# Patient Record
Sex: Female | Born: 1985 | Race: White | Hispanic: No | Marital: Married | Smoking: Current every day smoker
Health system: Southern US, Community
[De-identification: ages and names within clinical notes are randomized; demographics above are authoritative.]

## PROBLEM LIST (undated history)

## (undated) DIAGNOSIS — C539 Malignant neoplasm of cervix uteri, unspecified: Secondary | ICD-10-CM

## (undated) DIAGNOSIS — M1990 Unspecified osteoarthritis, unspecified site: Secondary | ICD-10-CM

## (undated) DIAGNOSIS — F329 Major depressive disorder, single episode, unspecified: Secondary | ICD-10-CM

## (undated) DIAGNOSIS — F32A Depression, unspecified: Secondary | ICD-10-CM

## (undated) DIAGNOSIS — R918 Other nonspecific abnormal finding of lung field: Secondary | ICD-10-CM

## (undated) DIAGNOSIS — R569 Unspecified convulsions: Secondary | ICD-10-CM

## (undated) DIAGNOSIS — K589 Irritable bowel syndrome without diarrhea: Secondary | ICD-10-CM

## (undated) DIAGNOSIS — F319 Bipolar disorder, unspecified: Secondary | ICD-10-CM

## (undated) DIAGNOSIS — K219 Gastro-esophageal reflux disease without esophagitis: Secondary | ICD-10-CM

## (undated) DIAGNOSIS — F410 Panic disorder [episodic paroxysmal anxiety] without agoraphobia: Secondary | ICD-10-CM

## (undated) HISTORY — DX: Unspecified convulsions: R56.9

## (undated) HISTORY — DX: Unspecified osteoarthritis, unspecified site: M19.90

## (undated) HISTORY — DX: Panic disorder (episodic paroxysmal anxiety): F41.0

## (undated) HISTORY — DX: Other nonspecific abnormal finding of lung field: R91.8

## (undated) HISTORY — DX: Irritable bowel syndrome, unspecified: K58.9

## (undated) HISTORY — DX: Gastro-esophageal reflux disease without esophagitis: K21.9

## (undated) HISTORY — PX: CERVICAL BIOPSY: SHX590

## (undated) HISTORY — PX: OTHER SURGICAL HISTORY: SHX169

## (undated) HISTORY — DX: Bipolar disorder, unspecified: F31.9

---
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
## 2004-07-13 ENCOUNTER — Emergency Department: Payer: Self-pay | Admitting: General Practice

## 2005-02-13 ENCOUNTER — Emergency Department: Payer: Self-pay | Admitting: Emergency Medicine

## 2005-02-19 ENCOUNTER — Emergency Department: Payer: Self-pay | Admitting: General Practice

## 2005-04-30 ENCOUNTER — Emergency Department: Payer: Self-pay | Admitting: Emergency Medicine

## 2005-05-07 ENCOUNTER — Emergency Department: Payer: Self-pay | Admitting: Unknown Physician Specialty

## 2005-08-28 ENCOUNTER — Observation Stay: Payer: Self-pay | Admitting: Obstetrics & Gynecology

## 2005-11-01 ENCOUNTER — Inpatient Hospital Stay: Payer: Self-pay

## 2005-12-03 ENCOUNTER — Other Ambulatory Visit: Payer: Self-pay

## 2005-12-03 ENCOUNTER — Emergency Department: Payer: Self-pay | Admitting: Emergency Medicine

## 2005-12-04 ENCOUNTER — Ambulatory Visit: Payer: Self-pay | Admitting: Emergency Medicine

## 2005-12-07 ENCOUNTER — Ambulatory Visit: Payer: Self-pay | Admitting: Emergency Medicine

## 2006-05-06 ENCOUNTER — Emergency Department: Payer: Self-pay | Admitting: Emergency Medicine

## 2006-05-07 ENCOUNTER — Ambulatory Visit: Payer: Self-pay | Admitting: Emergency Medicine

## 2006-08-17 DIAGNOSIS — G40409 Other generalized epilepsy and epileptic syndromes, not intractable, without status epilepticus: Secondary | ICD-10-CM

## 2006-08-17 DIAGNOSIS — R569 Unspecified convulsions: Secondary | ICD-10-CM

## 2006-08-17 HISTORY — DX: Unspecified convulsions: R56.9

## 2006-08-17 HISTORY — DX: Other generalized epilepsy and epileptic syndromes, not intractable, without status epilepticus: G40.409

## 2006-09-20 ENCOUNTER — Emergency Department: Payer: Self-pay | Admitting: Emergency Medicine

## 2008-05-25 ENCOUNTER — Emergency Department: Payer: Self-pay | Admitting: Emergency Medicine

## 2008-11-19 ENCOUNTER — Ambulatory Visit: Payer: Self-pay | Admitting: Family Medicine

## 2009-01-03 ENCOUNTER — Emergency Department: Payer: Self-pay | Admitting: Emergency Medicine

## 2009-02-26 ENCOUNTER — Emergency Department: Payer: Self-pay

## 2009-03-13 ENCOUNTER — Ambulatory Visit: Payer: Self-pay | Admitting: Neurology

## 2009-04-04 ENCOUNTER — Inpatient Hospital Stay: Payer: Self-pay | Admitting: Psychiatry

## 2009-04-16 ENCOUNTER — Emergency Department: Payer: Self-pay | Admitting: Emergency Medicine

## 2009-05-07 ENCOUNTER — Emergency Department: Payer: Self-pay | Admitting: Emergency Medicine

## 2009-05-31 ENCOUNTER — Ambulatory Visit: Payer: Self-pay | Admitting: Family Medicine

## 2014-10-17 ENCOUNTER — Ambulatory Visit
Admit: 2014-10-17 | Disposition: A | Discharge status: Home / Self Care | Payer: Self-pay | Attending: Obstetrics and Gynecology | Admitting: Obstetrics and Gynecology

## 2014-11-07 ENCOUNTER — Ambulatory Visit: Payer: Self-pay | Admitting: Obstetrics and Gynecology

## 2014-11-16 ENCOUNTER — Ambulatory Visit
Admit: 2014-11-16 | Disposition: A | Discharge status: Home / Self Care | Payer: Self-pay | Attending: Obstetrics and Gynecology | Admitting: Obstetrics and Gynecology

## 2014-11-21 ENCOUNTER — Ambulatory Visit
Admit: 2014-11-21 | Disposition: A | Discharge status: Home / Self Care | Payer: Self-pay | Attending: Obstetrics and Gynecology | Admitting: Obstetrics and Gynecology

## 2014-12-10 LAB — SURGICAL PATHOLOGY

## 2014-12-16 NOTE — Op Note (Signed)
Patient: This 29 year old Female had a surgical procedure performed on 21-Nov-2014.  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  In view of this, a conization is needed to rule out more deeply invasive cancer than would mandate a radical hysterectomy.  She has an IUD and was informed that we would have to remove this to do the cone biopsy.  The patient was placed in the dorsolithotomy position in candy cane stirrups after induction of general anesthesia.  She was prepped with betadine and draped in a sterile fashion.  The bladder was emptied with a straight catheter.  A weighted speculum was inserted and Lugols solution was placed on the cervix to define the cone margins on the exocervix.  The anterior and posterior cervix were grasped with single tooth tenaculae.  The os was probed with a sound to assess the direction of the canal.  Sutures of 2-0 Vicryl were placed laterally at 3 and 9 o'clock to secure the descending branches of the uterine arteries.  The cervix was incised with a scalpel circumfrentially and sutures were placed on the cone stroma at 12 and 6 o'clock for traction.  The cone was then dissected up to the top of the endocervical canal using scissors and the scalpel.  After it was removed, the suture at 12 was tied to mark the specimen for Pathology.  A cervical biopsy was done to biospy and remaining endocervix above the cone.  A series of interrupted mattress sutures with 2-0 Vicryl was placed in the posterior  half of the cervix for hemostasis.  The bovie was used to cauterize superfical bleeding.  The cone bed was packed with a Monsels soaked gauze and hemostasis was noted to be excellent. Surgicel was then placed in the cone bed and the sutures at 3 and 9 o'clock were tied together to hold it in place.    EBL was 50 ml and all counts were correct.  She was awakened and taken to the recovery room in good condition.    I spoke to her post op and instructed her not to put anything in the vagina until her appointment with [place] in clinic in 4 weeks.  She was told to call or come to the clinic/ER for bright red bleeding, fever or increasing pain.   Electronic Signatures: Mellody Drown (MD)  (Signed 06-Apr-16 09:38)  Authored: Patient and Date/Time, Operative Note   Last Updated: 06-Apr-16 09:38 by Mellody Drown (MD)

## 2014-12-26 ENCOUNTER — Ambulatory Visit: Payer: Self-pay

## 2015-01-07 ENCOUNTER — Telehealth: Payer: Self-pay | Admitting: *Deleted

## 2015-01-07 NOTE — Telephone Encounter (Signed)
-----   Message from [place] sent at 01/02/2015 12:31 PM EDT ----- Contact: [phone] Missed appt resch to 6/8 with [place] per [place] ok , pt wanting to get a call about results from [place] today

## 2015-01-07 NOTE — Telephone Encounter (Signed)
Reviewed pathology with patient and md recommendations. Discussed in length Dr. Blake Divine tx plan.  Either conservative observation vs hysterectomy.  Pt states that she is leaning more towards the option hysterectomy, but will discuss this further at next apt with Dr. Fransisca Connors on 6/8

## 2015-01-20 ENCOUNTER — Emergency Department
Admission: EM | Admit: 2015-01-20 | Discharge: 2015-01-20 | Disposition: A | Discharge status: Home / Self Care | Payer: Medicaid - Out of State | Attending: Emergency Medicine | Admitting: Emergency Medicine

## 2015-01-20 ENCOUNTER — Encounter: Payer: Self-pay | Admitting: Emergency Medicine

## 2015-01-20 DIAGNOSIS — L732 Hidradenitis suppurativa: Secondary | ICD-10-CM

## 2015-01-20 DIAGNOSIS — Z8541 Personal history of malignant neoplasm of cervix uteri: Secondary | ICD-10-CM | POA: Insufficient documentation

## 2015-01-20 DIAGNOSIS — Z720 Tobacco use: Secondary | ICD-10-CM | POA: Diagnosis not present

## 2015-01-20 DIAGNOSIS — M79621 Pain in right upper arm: Secondary | ICD-10-CM | POA: Diagnosis present

## 2015-01-20 HISTORY — DX: Malignant neoplasm of cervix uteri, unspecified: C53.9

## 2015-01-20 MED ORDER — KETOROLAC TROMETHAMINE 10 MG PO TABS: 10.0000 mg | ORAL_TABLET | Freq: Four times a day (QID) | ORAL | Status: DC | PRN

## 2015-01-20 MED ORDER — DOXYCYCLINE HYCLATE 100 MG PO CAPS: 100.0000 mg | ORAL_CAPSULE | Freq: Two times a day (BID) | ORAL | Status: DC

## 2015-01-20 MED ORDER — CLINDAMYCIN PHOS-BENZOYL PEROX 1-5 % EX GEL: Freq: Two times a day (BID) | CUTANEOUS | Status: DC

## 2015-01-20 NOTE — Discharge Instructions (Signed)
Hidradenitis Suppurativa, Sweat Gland Abscess Hidradenitis suppurativa is a long lasting (chronic), uncommon disease of the sweat glands. With this, boil-like lumps and scarring develop in the groin, some times under the arms (axillae), and under the breasts. It may also uncommonly occur behind the ears, in the crease of the buttocks, and around the genitals.  CAUSES  The cause is from a blocking of the sweat glands. They then become infected. It may cause drainage and odor. It is not contagious. So it cannot be given to someone else. It most often shows up in puberty (about 10 to 30 years of age). But it may happen much later. It is similar to acne which is a disease of the sweat glands. This condition is slightly more common in African-Americans and women. SYMPTOMS   Hidradenitis usually starts as one or more red, tender, swellings in the groin or under the arms (axilla).  Over a period of hours to days the lesions get larger. They often open to the skin surface, draining clear to yellow-colored fluid.  The infected area heals with scarring. DIAGNOSIS  Your caregiver makes this diagnosis by looking at you. Sometimes cultures (growing germs on plates in the lab) may be taken. This is to see what germ (bacterium) is causing the infection.  TREATMENT   Topical germ killing medicine applied to the skin (antibiotics) are the treatment of choice. Antibiotics taken by mouth (systemic) are sometimes needed when the condition is getting worse or is severe.  Avoid tight-fitting clothing which traps moisture in.  Dirt does not cause hidradenitis and it is not caused by poor hygiene.  Involved areas should be cleaned daily using an antibacterial soap. Some patients find that the liquid form of Lever 2000, applied to the involved areas as a lotion after bathing, can help reduce the odor related to this condition.  Sometimes surgery is needed to drain infected areas or remove scarred tissue. Removal of  large amounts of tissue is used only in severe cases.  Birth control pills may be helpful.  Oral retinoids (vitamin A derivatives) for 6 to 12 months which are effective for acne may also help this condition.  Weight loss will improve but not cure hidradenitis. It is made worse by being overweight. But the condition is not caused by being overweight.  This condition is more common in people who have had acne.  It may become worse under stress. There is no medical cure for hidradenitis. It can be controlled, but not cured. The condition usually continues for years with periods of getting worse and getting better (remission). Document Released: 03/17/2004 Document Revised: 10/26/2011 Document Reviewed: 11/03/2013 ExitCare Patient Information 2015 [employer]. This information is not intended to replace advice given to you by your health care provider. Make sure you discuss any questions you have with your health care provider.  

## 2015-01-20 NOTE — ED Provider Notes (Signed)
CSN: [id number]     Arrival date & time 01/20/15  0921 History   First MD Initiated Contact with Patient 01/20/15 [phone]     Chief Complaint  Patient presents with  . Abscess     (Consider location/radiation/quality/duration/timing/severity/associated sxs/prior Treatment) HPI Patient presents today with a tender swollen area to her right axilla rates the pain as about a 5 out of 10 burning pain states she noticed it last night while trying to sleep and is here today for evaluation nothing is making this appreciably better or worse with any type of touch or pressure to the area no fevers chills drainage and no other complaints at this time Past Medical History  Diagnosis Date  . Cervical cancer    Past Surgical History  Procedure Laterality Date  . Biospy     No family history on file. History  Substance Use Topics  . Smoking status: Current Every Day Smoker  . Smokeless tobacco: Not on file  . Alcohol Use: No   OB History    No data available     Review of Systems  Constitutional: Negative.   HENT: Negative.   Eyes: Negative.   Respiratory: Negative.   Cardiovascular: Negative.   Gastrointestinal: Negative.   Musculoskeletal: Negative.   All other systems reviewed and are negative.     Allergies  Dilaudid; Morphine and related; and Paxil  Home Medications   Prior to Admission medications   Medication Sig Start Date End Date Taking? Authorizing Provider  clindamycin-benzoyl peroxide (BENZACLIN) gel Apply topically 2 (two) times daily. 01/20/15   Joyell Emami Luanna Cole Zafir Schauer, PA-C  doxycycline (VIBRAMYCIN) 100 MG capsule Take 1 capsule (100 mg total) by mouth 2 (two) times daily. 01/20/15   Clytee Heinrich Luanna Cole Jehiel Koepp, PA-C  ketorolac (TORADOL) 10 MG tablet Take 1 tablet (10 mg total) by mouth every 6 (six) hours as needed. 01/20/15   Carmie Lanpher William C Kaelynne Christley, PA-C   BP 123/64 mmHg  Pulse 72  Temp(Src) 98.8 F (37.1 C) (Oral)  Resp 18  Ht 5\' 5"  (1.651 m)  Wt 170 lb (77.111 kg)  BMI  28.29 kg/m2  SpO2 97%  LMP 01/19/2015 (Exact Date) Physical Exam Female appearing stated age well-developed well-nourished no acute distress Vitals reviewed Head ears eyes nose neck and throat examination this patient was unremarkable Cardiovascular regular rate and rhythm no murmurs rubs gallops Pulmonary lungs clear to auscultation bilaterally Musculoskeletal moving all extremities without difficulty full range of motion Neuro exam nonfocal cranial nerves II through XII grossly intact Skin patient has a red firm warm area approximately one and half centimeters in size in the crease of her right axilla indurated nonfluctuant ED Course  Procedures   MDM  The patient with one day of redness to her right armpit tender and firm states has not noticed it before there is no fluctuance to the area no visible head to an abscess discussed the redness start her on an about equipment normal and a biotics have follow-up in 2 days for wound check and recommend that she do warm compresses home return here otherwise for any acute concerns or worsening symptoms Final diagnoses:  Hidradenitis suppurativa of right axilla        Perian Tedder Verdene Rio, PA-C 01/20/15 1025  Lavonia Drafts, MD 01/20/15 1110

## 2015-01-20 NOTE — ED Notes (Signed)
Pt with abscess to right axillary noticed last night.

## 2015-01-23 ENCOUNTER — Inpatient Hospital Stay: Payer: Medicaid - Out of State | Attending: Obstetrics and Gynecology | Admitting: Obstetrics and Gynecology

## 2015-01-23 ENCOUNTER — Encounter: Payer: Self-pay | Admitting: Obstetrics and Gynecology

## 2015-01-23 VITALS — BP 119/75 | HR 70 | Temp 96.6°F | Resp 18 | Ht 65.0 in | Wt 177.5 lb

## 2015-01-23 DIAGNOSIS — K589 Irritable bowel syndrome without diarrhea: Secondary | ICD-10-CM

## 2015-01-23 DIAGNOSIS — F410 Panic disorder [episodic paroxysmal anxiety] without agoraphobia: Secondary | ICD-10-CM

## 2015-01-23 DIAGNOSIS — K219 Gastro-esophageal reflux disease without esophagitis: Secondary | ICD-10-CM

## 2015-01-23 DIAGNOSIS — D069 Carcinoma in situ of cervix, unspecified: Secondary | ICD-10-CM | POA: Insufficient documentation

## 2015-01-23 DIAGNOSIS — M1990 Unspecified osteoarthritis, unspecified site: Secondary | ICD-10-CM

## 2015-01-23 DIAGNOSIS — G40909 Epilepsy, unspecified, not intractable, without status epilepticus: Secondary | ICD-10-CM

## 2015-01-23 DIAGNOSIS — F519 Sleep disorder not due to a substance or known physiological condition, unspecified: Secondary | ICD-10-CM

## 2015-01-23 DIAGNOSIS — F319 Bipolar disorder, unspecified: Secondary | ICD-10-CM

## 2015-01-23 DIAGNOSIS — Z79899 Other long term (current) drug therapy: Secondary | ICD-10-CM

## 2015-01-23 DIAGNOSIS — Z87891 Personal history of nicotine dependence: Secondary | ICD-10-CM

## 2015-01-23 NOTE — Progress Notes (Addendum)
Gynecologic Oncology Interval Note  Referring Provider: Dr Janice Norrie  Chief Concern: CIN III  Subjective:  Terry Walters is a 29 y.o. woman who presents today for continued surveillance for history of dysplasia.     She underwent cone biopsy at [facility] with Dr Fransisca Connors on 11/21/14. Mirena IUD was removed at that time.  No complaints today.  A. ECTOCERVIX; CONIZATION:  - HIGH-GRADE SQUAMOUS INTRAEPITHELIAL LESION (CIN 2-3).  - AN ECTOCERVICAL MARGIN IS INVOLVED IN THE 9 TO 12:00 QUADRANT.  - THE ENDOCERVICAL MARGINS OF EXCISION ARE NEGATIVE   B. ENDOCERVIX; CONIZATION:  - BENIGN FIBROUS TISSUE.   History:   This patient is a 29 year old G1 P1 who was seen by Dr Janice Norrie for evaluation of a PAP 04/25/14 showing ASCUS and HPV HR+. She also had heavy menstrual periods, some bleeding in between and inability to conceive. Her husband has a low sperm count due to chemotherapy for recurrent Hodgkins disease.   Dr. Laymond Purser in [place] performed a colposcopy in 10/15.  A large mosaic lesion was noted on the anterior cervix and biopsy showed high-grade squamous intraepithelial lesion.  A LEEP was planned and the patient also requested a tubal ligation.  After discussion of various contraceptive methods the patient elected to have a Mirena IUD inserted as it would help with her menorrhagia and would be reversible.   On September 25, 2014 she underwent LEEP, hysteroscopy and Mirena IUD insertion. The LEEP was done in three pieces (anterior, posterior, endocervix). 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  Margins were positive and there was endocervical gland involvement.  This pathology was reviewed and confirmed at the [facility] by Dr Alisia Ferrari.  Endometrium was proliferative.  She has a history of a learning disorder, seizures and significant bipolar mood disorder  and is taking Celexa. She has a history of depression with hospitalization and suicide attempt.    Problem List: Patient Active Problem List   Diagnosis Date Noted  . CIN III (cervical intraepithelial neoplasia III) 01/23/2015    Past Medical History: Past Medical History  Diagnosis Date  . Cervical cancer   . Seizures   . Bipolar 1 disorder   . Arthritis   . IBS (irritable bowel syndrome)   . Acid reflux disease   . Panic attack     Past Surgical History: Past Surgical History  Procedure Laterality Date  . Biospy    . Cervical biopsy      Family History: Family History  Problem Relation Age of Onset  . COPD Father   . Heart disease Father   . Hypertension Maternal Grandfather   . Heart disease Maternal Grandmother   . Diverticulosis Maternal Grandmother   . Stroke Maternal Aunt     Social History: History   Social History  . Marital Status: Single    Spouse Name: N/A  . Number of Children: N/A  . Years of Education: N/A   Occupational History  . Not on file.   Social History Main Topics  . Smoking status: Current Every Day Smoker -- 2.00 packs/day for 15 years    Types: Cigarettes  . Smokeless tobacco: Former Systems developer    Quit date: 01/22/2005  . Alcohol Use: No  . Drug Use: No  . Sexual Activity: Not on file   Other Topics Concern  . Not on file   Social History  Narrative    Allergies: Allergies  Allergen Reactions  . Dilaudid [Hydromorphone Hcl]   . Morphine And Related   . Paxil [Paroxetine Hcl]     Current Medications: Current Outpatient Prescriptions  Medication Sig Dispense Refill  . clindamycin-benzoyl peroxide (BENZACLIN) gel Apply topically 2 (two) times daily. 25 g 0  . ketorolac (TORADOL) 10 MG tablet Take 1 tablet (10 mg total) by mouth every 6 (six) hours as needed. 20 tablet 0  . doxycycline (VIBRAMYCIN) 100 MG capsule Take 1 capsule (100 mg total) by mouth 2 (two) times daily. (Patient not taking: Reported on 01/23/2015) 20  capsule 0   No current facility-administered medications for this visit.    Review of Systems Pertinent items are noted in HPI.  Objective:  Physical Examination:  BP 119/75 mmHg  Pulse 70  Temp(Src) 96.6 F (35.9 C) (Tympanic)  Resp 18  Ht 5\' 5"  (1.651 m)  Wt 177 lb 7.5 oz (80.5 kg)  BMI 29.53 kg/m2  LMP 01/19/2015 (Exact Date)  General appearance: alert, cooperative and appears stated age HEENT:PERRLA, sclera clear, anicteric, neck supple with midline trachea and thyroid without masses Lymph node survey: negative Cardiovascular: normal rate and rhythm, no murmurs Respiratory: clear to A and P Breast exam: not done. Abdomen: soft, no masses or hernias. Non tender. Back: inspection of back is normal Extremities: no edema or lesions  Skin exam: normal coloration and turgor, no rashes, no suspicious skin lesions noted. Neurological exam reveals: alert, oriented, normal speech, no focal findings or movement disorder noted.  Pelvic: exam chaperoned by nurse;  Vulva: normal appearing vulva with no masses, tenderness or lesions; Vagina: normal; Cervix: healing well; Adnexa: nontender, ; Uterus: uterus is normal size, shape, consistency and nontender;  Rectal: deferred.     Assessment:  Terry Walters is a 29 y.o. female with a history of CIN III s/p cone biopsy two months ago.  No evidence of invasive cancer, but had positive ectocervical margin from 9-12 o'clock.  Plan:   Problem List Items Addressed This Visit    CIN III (cervical intraepithelial neoplasia III) - Primary     Although she had a positive ectocervical margin, it is possible that all the CIN III was all removed.   I discussed the options of expectant management with PAPs versus hysterectomy.  She does not want to proceed with hysterectomy unless it is absolutely needed.  She has a lot going on now with her husband having brain cancer.  Suggested return to clinic in  3 months for PAP.  She is comfortable with the plan  and had her questions answered.   We spoke about contraception, since the IUD was removed when the cone biopsy was done in April.  She is not sexually active now because her husband is taking chemotherapy for brain cancer.  We reminded her of the need for contraception if they do have intercourse, and that she could get pregnant even though he was taking chemotherapy.  Mellody Drown, MD

## 2015-03-18 LAB — COMPREHENSIVE METABOLIC PANEL
ALK PHOS: 90 U/L
Albumin: 4 g/dL
Anion Gap: 5 — ABNORMAL LOW (ref 7–16)
BILIRUBIN TOTAL: 0.5 mg/dL
BUN: 14 mg/dL
CALCIUM: 8.9 mg/dL
CO2: 25 mmol/L
Chloride: 109 mmol/L
Creatinine: 0.73 mg/dL
Glucose: 85 mg/dL
Potassium: 4 mmol/L
SGOT(AST): 20 U/L
SGPT (ALT): 21 U/L
SODIUM: 139 mmol/L
Total Protein: 7.1 g/dL

## 2015-03-18 LAB — CBC WITH DIFFERENTIAL/PLATELET
Basophil #: 0 10*3/uL (ref 0.0–0.1)
Basophil %: 0.2 %
EOS ABS: 0.3 10*3/uL (ref 0.0–0.7)
Eosinophil %: 2.6 %
HCT: 41.4 % (ref 35.0–47.0)
HGB: 13.4 g/dL (ref 12.0–16.0)
LYMPHS PCT: 14.3 %
Lymphocyte #: 1.7 10*3/uL (ref 1.0–3.6)
MCH: 28.8 pg (ref 26.0–34.0)
MCHC: 32.4 g/dL (ref 32.0–36.0)
MCV: 89 fL (ref 80–100)
MONO ABS: 0.6 x10 3/mm (ref 0.2–0.9)
MONOS PCT: 5.2 %
NEUTROS ABS: 9.3 10*3/uL — AB (ref 1.4–6.5)
Neutrophil %: 77.7 %
PLATELETS: 211 10*3/uL (ref 150–440)
RBC: 4.65 10*6/uL (ref 3.80–5.20)
RDW: 12.3 % (ref 11.5–14.5)
WBC: 12 10*3/uL — AB (ref 3.6–11.0)

## 2015-03-18 LAB — URINALYSIS, COMPLETE
Bilirubin,UR: NEGATIVE
Glucose,UR: NEGATIVE mg/dL (ref 0–75)
KETONE: NEGATIVE
NITRITE: NEGATIVE
Ph: 5 (ref 4.5–8.0)
Specific Gravity: 1.03 (ref 1.003–1.030)

## 2015-04-24 ENCOUNTER — Inpatient Hospital Stay: Payer: Medicaid - Out of State

## 2015-05-15 ENCOUNTER — Encounter: Payer: Self-pay | Admitting: Obstetrics and Gynecology

## 2015-05-15 ENCOUNTER — Encounter (INDEPENDENT_AMBULATORY_CARE_PROVIDER_SITE_OTHER): Payer: Self-pay

## 2015-05-15 ENCOUNTER — Inpatient Hospital Stay: Payer: Medicaid - Out of State | Attending: Obstetrics and Gynecology | Admitting: Obstetrics and Gynecology

## 2015-05-15 VITALS — BP 112/60 | HR 66 | Temp 98.0°F | Wt 175.3 lb

## 2015-05-15 DIAGNOSIS — R300 Dysuria: Secondary | ICD-10-CM | POA: Diagnosis not present

## 2015-05-15 DIAGNOSIS — F17210 Nicotine dependence, cigarettes, uncomplicated: Secondary | ICD-10-CM | POA: Diagnosis not present

## 2015-05-15 DIAGNOSIS — Z86001 Personal history of in-situ neoplasm of cervix uteri: Secondary | ICD-10-CM | POA: Diagnosis not present

## 2015-05-15 DIAGNOSIS — F319 Bipolar disorder, unspecified: Secondary | ICD-10-CM | POA: Insufficient documentation

## 2015-05-15 DIAGNOSIS — A630 Anogenital (venereal) warts: Secondary | ICD-10-CM | POA: Insufficient documentation

## 2015-05-15 DIAGNOSIS — D069 Carcinoma in situ of cervix, unspecified: Secondary | ICD-10-CM | POA: Diagnosis not present

## 2015-05-15 MED ORDER — SULFAMETHOXAZOLE-TRIMETHOPRIM 800-160 MG PO TABS: 1.0000 | ORAL_TABLET | Freq: Every day | ORAL | Status: AC

## 2015-05-15 NOTE — Progress Notes (Signed)
Patient reports burning during urination.  Urine sample collected and sent to lab

## 2015-05-15 NOTE — Progress Notes (Signed)
Gynecologic Oncology Interval Note  Referring Provider: Dr Janice Norrie  Chief Concern: CIN III  Subjective:  Terry Walters is a 29 y.o. woman who presents today for continued surveillance for history of cervical dysplasia.   Had cone with positive ectocervical margin earlier this year.  Chose conservative management with PAP follow up over hysterectomy.  Complains of dysuria today and thinks she has a UTI. No fever or chills, but some dull back pain.  History:   This patient is a 29 year old G1 P1 who was seen by Dr Janice Norrie for evaluation of a PAP 04/25/14 showing ASCUS and HPV HR+. She also had heavy menstrual periods, some bleeding in between and inability to conceive. Her husband has a low sperm count due to chemotherapy for recurrent Hodgkins disease.   Dr. Laymond Purser in [place] performed a colposcopy in 10/15.  A large mosaic lesion was noted on the anterior cervix and biopsy showed high-grade squamous intraepithelial lesion.  A LEEP was planned and the patient also requested a tubal ligation.  After discussion of various contraceptive methods the patient elected to have a Mirena IUD inserted as it would help with her menorrhagia and would be reversible.   On September 25, 2014 she underwent LEEP, hysteroscopy and Mirena IUD insertion. The LEEP was done in three pieces (anterior, posterior, endocervix). 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  Margins were positive and there was endocervical gland involvement.  This pathology was reviewed and confirmed at the [facility] by Dr Alisia Ferrari.  Endometrium was proliferative.  She underwent cone biopsy at [facility] with Dr Fransisca Connors on 11/21/14. Mirena IUD was removed at that time.     A. ECTOCERVIX; CONIZATION:  - HIGH-GRADE SQUAMOUS INTRAEPITHELIAL LESION (CIN 2-3).  - AN ECTOCERVICAL MARGIN IS INVOLVED IN THE 9 TO  12:00 QUADRANT.  - THE ENDOCERVICAL MARGINS OF EXCISION ARE NEGATIVE   B. ENDOCERVIX; CONIZATION:  - BENIGN FIBROUS TISSUE.   She has a history of a learning disorder, seizures and significant bipolar mood disorder and is taking Celexa. She has a history of depression with hospitalization and suicide attempt.    Problem List: Patient Active Problem List   Diagnosis Date Noted  . Dysuria 05/15/2015  . CIN III (cervical intraepithelial neoplasia III) 01/23/2015    Past Medical History: Past Medical History  Diagnosis Date  . Cervical cancer   . Seizures   . Bipolar 1 disorder   . Arthritis   . IBS (irritable bowel syndrome)   . Acid reflux disease   . Panic attack     Past Surgical History: Past Surgical History  Procedure Laterality Date  . Biospy    . Cervical biopsy      Family History: Family History  Problem Relation Age of Onset  . COPD Father   . Heart disease Father   . Hypertension Maternal Grandfather   . Heart disease Maternal Grandmother   . Diverticulosis Maternal Grandmother   . Stroke Maternal Aunt     Social History: Social History   Social History  . Marital Status: Single    Spouse Name: N/A  . Number of Children: N/A  . Years of Education: N/A   Occupational History  . Not on file.   Social History Main Topics  . Smoking status: Current Every Day Smoker -- 2.00 packs/day for 15 years    Types: Cigarettes  .  Smokeless tobacco: Former Systems developer    Quit date: 01/22/2005  . Alcohol Use: No  . Drug Use: No  . Sexual Activity: Not on file   Other Topics Concern  . Not on file   Social History Narrative    Allergies: Allergies  Allergen Reactions  . Dilaudid [Hydromorphone Hcl]   . Morphine And Related   . Paxil [Paroxetine Hcl]     Current Medications: Current Outpatient Prescriptions  Medication Sig Dispense Refill  . sulfamethoxazole-trimethoprim (BACTRIM DS,SEPTRA DS) 800-160 MG tablet Take 1 tablet by mouth daily. 7 tablet 0    No current facility-administered medications for this visit.    Review of Systems Pertinent items are noted in HPI.  Objective:  Physical Examination:  BP 112/60 mmHg  Pulse 66  Temp(Src) 98 F (36.7 C) (Oral)  Wt 175 lb 4.3 oz (79.5 kg)  General appearance: alert, cooperative and appears stated age HEENT:PERRLA, sclera clear, anicteric, neck supple with midline trachea and thyroid without masses Lymph node survey: negative Cardiovascular: normal rate and rhythm, no murmurs Respiratory: clear to A and P Breast exam: not done. Abdomen: soft, no masses or hernias. Non tender. Back: inspection of back is normal Extremities: no edema or lesions  Skin exam: normal coloration and turgor, no rashes, no suspicious skin lesions noted. Neurological exam reveals: alert, oriented, normal speech, no focal findings or movement disorder noted.  Pelvic: exam chaperoned by nurse;  Vulva: normal appearing vulva with no masses, tenderness or lesions; Vagina: normal; Cervix: healed well; Adnexa: nontender, ; Uterus: uterus is normal size, shape, consistency and nontender;  Rectal: deferred.     Assessment:  Terry Walters is a 29 y.o. female with a history of CIN III s/p cone biopsy with positive ectocervical margin from 9-12 o'clock. She does not want to proceed with hysterectomy unless it is absolutely needed.  Although she had a positive ectocervical margin, it is possible that all the CIN III was all removed.   She has a lot going on now with her husband having brain cancer and undergoing RT.    She has UTI symptoms and has used Bactrim in the past.  Plan:   Problem List Items Addressed This Visit      Genitourinary   CIN III (cervical intraepithelial neoplasia III) - Primary     Other   Dysuria     PAP done today.  She will RTC in 6 months for repeat PAP.  She is comfortable with the plan and had her questions answered.   We will give her a 7 day Rx for Bactrim DS bid for presumed  UTI.  UA/C&S sent.  Mellody Drown, MD

## 2015-05-15 NOTE — Progress Notes (Signed)
Assisted physician with pelvic exam and pap smear.

## 2015-05-17 LAB — URINE CULTURE

## 2015-07-30 ENCOUNTER — Encounter: Payer: Self-pay | Admitting: Emergency Medicine

## 2015-07-30 ENCOUNTER — Emergency Department
Admission: EM | Admit: 2015-07-30 | Discharge: 2015-07-30 | Disposition: A | Discharge status: Home / Self Care | Payer: Medicaid Other | Attending: Emergency Medicine | Admitting: Emergency Medicine

## 2015-07-30 DIAGNOSIS — Z3202 Encounter for pregnancy test, result negative: Secondary | ICD-10-CM | POA: Diagnosis not present

## 2015-07-30 DIAGNOSIS — R569 Unspecified convulsions: Secondary | ICD-10-CM | POA: Diagnosis not present

## 2015-07-30 DIAGNOSIS — R300 Dysuria: Secondary | ICD-10-CM | POA: Diagnosis present

## 2015-07-30 DIAGNOSIS — N390 Urinary tract infection, site not specified: Secondary | ICD-10-CM

## 2015-07-30 DIAGNOSIS — F17210 Nicotine dependence, cigarettes, uncomplicated: Secondary | ICD-10-CM | POA: Insufficient documentation

## 2015-07-30 DIAGNOSIS — F319 Bipolar disorder, unspecified: Secondary | ICD-10-CM | POA: Diagnosis not present

## 2015-07-30 DIAGNOSIS — F419 Anxiety disorder, unspecified: Secondary | ICD-10-CM | POA: Diagnosis not present

## 2015-07-30 LAB — URINALYSIS COMPLETE WITH MICROSCOPIC (ARMC ONLY)
Bilirubin Urine: NEGATIVE
Glucose, UA: NEGATIVE mg/dL
NITRITE: NEGATIVE
PROTEIN: 30 mg/dL — AB
SPECIFIC GRAVITY, URINE: 1.031 — AB (ref 1.005–1.030)
pH: 6 (ref 5.0–8.0)

## 2015-07-30 LAB — POCT PREGNANCY, URINE: Preg Test, Ur: NEGATIVE

## 2015-07-30 MED ORDER — PHENAZOPYRIDINE HCL 200 MG PO TABS
200.0000 mg | ORAL_TABLET | Freq: Once | ORAL | Status: AC
  Administered 2015-07-30: 200 mg via ORAL
  Filled 2015-07-30: qty 1

## 2015-07-30 MED ORDER — SULFAMETHOXAZOLE-TRIMETHOPRIM 800-160 MG PO TABS: 1.0000 | ORAL_TABLET | Freq: Two times a day (BID) | ORAL | Status: DC

## 2015-07-30 MED ORDER — PHENAZOPYRIDINE HCL 200 MG PO TABS: 200.0000 mg | ORAL_TABLET | Freq: Three times a day (TID) | ORAL | Status: DC | PRN

## 2015-07-30 NOTE — ED Provider Notes (Signed)
[facility] Emergency Department Provider Note  ____________________________________________  Time seen: Approximately 12:37 PM  I have reviewed the triage vital signs and the nursing notes.   HISTORY  Chief Complaint Recurrent UTI    HPI Terry Walters is a 28 y.o. female patient complain of 5 days of dysuria. Patient stated this frequency and hesitancy with this complaint. Patient denies any vaginal discharge. Patient denies any fever or flank pain. Patient states she has a history of UTIs. No palliative measures taken for this complaint. Patient rates the pain as 8/10 described as burning.   Past Medical History  Diagnosis Date  . Cervical cancer ([place])   . Seizures ([place])   . Bipolar 1 disorder ([place])   . Arthritis   . IBS (irritable bowel syndrome)   . Acid reflux disease   . Panic attack     Patient Active Problem List   Diagnosis Date Noted  . Dysuria 05/15/2015  . CIN III (cervical intraepithelial neoplasia III) 01/23/2015    Past Surgical History  Procedure Laterality Date  . Biospy    . Cervical biopsy      No current outpatient prescriptions on file.  Allergies Dilaudid; Morphine and related; and Paxil  Family History  Problem Relation Age of Onset  . COPD Father   . Heart disease Father   . Hypertension Maternal Grandfather   . Heart disease Maternal Grandmother   . Diverticulosis Maternal Grandmother   . Stroke Maternal Aunt     Social History Social History  Substance Use Topics  . Smoking status: Current Every Day Smoker -- 2.00 packs/day for 15 years    Types: Cigarettes  . Smokeless tobacco: Former Systems developer    Quit date: 01/22/2005  . Alcohol Use: No    Review of Systems Constitutional: No fever/chills Eyes: No visual changes. ENT: No sore throat. Cardiovascular: Denies chest pain. Respiratory: Denies shortness of breath. Gastrointestinal: No abdominal pain.  No nausea, no vomiting.  No diarrhea.  No  constipation. Genitourinary: Positive for dysuria. Musculoskeletal: Negative for back pain. Skin: Negative for rash. Neurological: Negative for headaches, focal weakness or numbness. Psychiatric:Bipolar, anxiety, seizures. Allergic/Immunilogical: Medication list  10-point ROS otherwise negative.  ____________________________________________   PHYSICAL EXAM:  VITAL SIGNS: ED Triage Vitals  Enc Vitals Group     BP 07/30/15 1206 130/61 mmHg     Pulse Rate 07/30/15 1206 86     Resp 07/30/15 1206 20     Temp 07/30/15 1206 98.1 F (36.7 C)     Temp Source 07/30/15 1206 Oral     SpO2 07/30/15 1206 96 %     Weight 07/30/15 1206 165 lb (74.844 kg)     Height 07/30/15 1206 5\' 5"  (1.651 m)     Head Cir --      Peak Flow --      Pain Score 07/30/15 1207 8     Pain Loc --      Pain Edu? --      Excl. in [place]? --     Constitutional: Alert and oriented. Well appearing and in no acute distress. Eyes: Conjunctivae are normal. PERRL. EOMI. Head: Atraumatic. Nose: No congestion/rhinnorhea. Mouth/Throat: Mucous membranes are moist.  Oropharynx non-erythematous. Neck: No stridor. No cervical spine tenderness to palpation. Hematological/Lymphatic/Immunilogical: No cervical lymphadenopathy. Cardiovascular: Normal rate, regular rhythm. Grossly normal heart sounds.  Good peripheral circulation. Respiratory: Normal respiratory effort.  No retractions. Lungs CTAB. Gastrointestinal: Soft and nontender. No distention. No abdominal bruits. No CVA tenderness.  Genitourinary: Deferred Musculoskeletal: No lower extremity tenderness nor edema.  No joint effusions. Neurologic:  Normal speech and language. No gross focal neurologic deficits are appreciated. No gait instability. Skin:  Skin is warm, dry and intact. No rash noted. Psychiatric: Mood and affect are normal. Speech and behavior are normal.  ____________________________________________   LABS (all labs ordered are listed, but only abnormal  results are displayed)  Labs Reviewed  URINALYSIS COMPLETEWITH MICROSCOPIC ([place]) - Abnormal; Notable for the following:    Color, Urine AMBER (*)    APPearance CLOUDY (*)    Ketones, ur TRACE (*)    Specific Gravity, Urine 1.031 (*)    Hgb urine dipstick 1+ (*)    Protein, ur 30 (*)    Leukocytes, UA 3+ (*)    Bacteria, UA RARE (*)    Squamous Epithelial / LPF 6-30 (*)    All other components within normal limits  POC URINE PREG, ED  POCT PREGNANCY, URINE   ____________________________________________  EKG   ____________________________________________  RADIOLOGY   ____________________________________________   PROCEDURES  Procedure(s) performed: None  Critical Care performed: No  ____________________________________________   INITIAL IMPRESSION / ASSESSMENT AND PLAN / ED COURSE  Pertinent labs & imaging results that were available during my care of the patient were reviewed by me and considered in my medical decision making (see chart for details). Urinary tract infection. Patient given a prescription for Bactrim DS and Pyridium. Patient advised to follow-up with scheduled new family practice doctor. ____________________________________________   FINAL CLINICAL IMPRESSION(S) / ED DIAGNOSES  Final diagnoses:  UTI (lower urinary tract infection)      Sable Feil, PA-C 07/30/15 1335  Harvest Dark, MD 07/30/15 1558

## 2015-07-30 NOTE — ED Notes (Signed)
Pt with painful urination for five days. Pt with hx of frequent UTI's.

## 2015-11-13 ENCOUNTER — Inpatient Hospital Stay: Payer: Medicaid Other | Admitting: *Deleted

## 2015-11-13 ENCOUNTER — Other Ambulatory Visit: Payer: Self-pay

## 2015-11-13 ENCOUNTER — Inpatient Hospital Stay: Payer: Medicaid Other | Attending: Obstetrics and Gynecology | Admitting: Obstetrics and Gynecology

## 2015-11-13 VITALS — BP 115/75 | HR 77 | Temp 97.8°F | Resp 18 | Wt 173.3 lb

## 2015-11-13 DIAGNOSIS — K589 Irritable bowel syndrome without diarrhea: Secondary | ICD-10-CM | POA: Insufficient documentation

## 2015-11-13 DIAGNOSIS — R300 Dysuria: Secondary | ICD-10-CM

## 2015-11-13 DIAGNOSIS — Z86001 Personal history of in-situ neoplasm of cervix uteri: Secondary | ICD-10-CM | POA: Diagnosis not present

## 2015-11-13 DIAGNOSIS — A630 Anogenital (venereal) warts: Secondary | ICD-10-CM | POA: Diagnosis not present

## 2015-11-13 DIAGNOSIS — K219 Gastro-esophageal reflux disease without esophagitis: Secondary | ICD-10-CM | POA: Diagnosis not present

## 2015-11-13 DIAGNOSIS — F319 Bipolar disorder, unspecified: Secondary | ICD-10-CM

## 2015-11-13 DIAGNOSIS — F17210 Nicotine dependence, cigarettes, uncomplicated: Secondary | ICD-10-CM

## 2015-11-13 DIAGNOSIS — D069 Carcinoma in situ of cervix, unspecified: Secondary | ICD-10-CM

## 2015-11-13 DIAGNOSIS — M1990 Unspecified osteoarthritis, unspecified site: Secondary | ICD-10-CM | POA: Insufficient documentation

## 2015-11-13 DIAGNOSIS — N390 Urinary tract infection, site not specified: Secondary | ICD-10-CM | POA: Insufficient documentation

## 2015-11-13 NOTE — Progress Notes (Signed)
Patient having dysuria for about 2 weeks.

## 2015-11-13 NOTE — Progress Notes (Signed)
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 woman who presents today for continued surveillance for history of cervical dysplasia.   Had cone with positive ectocervical margin.  Chose conservative management with PAP follow up over hysterectomy.  PAP in 9/16 was normal and HR HPV was negative.   Complains of dysuria today and thinks she may have a UTI, same as last visit. No fever or chills.  History:   This G1 P1 patient was seen by Dr Janice Norrie for evaluation of a PAP 04/25/14 showing ASCUS and HPV HR+. She also had heavy menstrual periods, some bleeding in between and inability to conceive. Her husband has a low sperm count due to chemotherapy for recurrent Hodgkins disease.   Dr. Laymond Purser in [place] performed a colposcopy in 10/15.  A large mosaic lesion was noted on the anterior cervix and biopsy showed high-grade squamous intraepithelial lesion.  A LEEP was planned and the patient also requested a tubal ligation.  After discussion of various contraceptive methods the patient elected to have a Mirena IUD inserted as it would help with her menorrhagia and would be reversible.   On September 25, 2014 she underwent LEEP, hysteroscopy and Mirena IUD insertion. The LEEP was done in three pieces (anterior, posterior, endocervix). 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  Margins were positive and there was endocervical gland involvement.  This pathology was reviewed and confirmed at the [facility] by Dr Alisia Ferrari.  Endometrium was proliferative.  She underwent cone biopsy at [facility] with Dr Fransisca Connors on 11/21/14. Mirena IUD was removed at that time.     A. ECTOCERVIX; CONIZATION:  - HIGH-GRADE SQUAMOUS INTRAEPITHELIAL LESION (CIN 2-3).  - AN ECTOCERVICAL MARGIN IS INVOLVED IN  THE 9 TO 12:00 QUADRANT.  - THE ENDOCERVICAL MARGINS OF EXCISION ARE NEGATIVE   B. ENDOCERVIX; CONIZATION:  - BENIGN FIBROUS TISSUE.   She has a history of a learning disorder, seizures and significant bipolar mood disorder and is taking Celexa. She has a history of depression with hospitalization and suicide attempt.    Problem List: Patient Active Problem List   Diagnosis Date Noted  . Dysuria 05/15/2015  . CIN III (cervical intraepithelial neoplasia III) 01/23/2015    Past Medical History: Past Medical History  Diagnosis Date  . Cervical cancer ([place])   . Seizures ([place])   . Bipolar 1 disorder ([place])   . Arthritis   . IBS (irritable bowel syndrome)   . Acid reflux disease   . Panic attack     Past Surgical History: Past Surgical History  Procedure Laterality Date  . Biospy    . Cervical biopsy      Family History: Family History  Problem Relation Age of Onset  . COPD Father   . Heart disease Father   . Hypertension Maternal Grandfather   . Heart disease Maternal Grandmother   . Diverticulosis Maternal Grandmother   . Stroke Maternal Aunt     Social History: Social History   Social History  . Marital Status: Single    Spouse Name: N/A  . Number of Children: N/A  . Years of Education: N/A   Occupational History  . Not on file.   Social History Main Topics  . Smoking status: Current Every Day Smoker -- 2.00 packs/day  for 15 years    Types: Cigarettes  . Smokeless tobacco: Former Systems developer    Quit date: 01/22/2005  . Alcohol Use: No  . Drug Use: No  . Sexual Activity: Not on file   Other Topics Concern  . Not on file   Social History Narrative    Allergies: Allergies  Allergen Reactions  . Dilaudid [Hydromorphone Hcl]   . Morphine And Related   . Paxil [Paroxetine Hcl]     Current Medications: No current outpatient prescriptions on file.   No current facility-administered medications for this visit.    Review of Systems Pertinent items are  noted in HPI.  Objective:  Physical Examination:  BP 115/75 mmHg  Pulse 77  Temp(Src) 97.8 F (36.6 C) (Tympanic)  Resp 18  Wt 173 lb 4.5 oz (78.6 kg)  General appearance: alert, cooperative and appears stated age HEENT:PERRLA, sclera clear, anicteric, neck supple with midline trachea and thyroid without masses Lymph node survey: negative Cardiovascular: normal rate and rhythm, no murmurs Respiratory: clear to A and P Breast exam: not done. Abdomen: soft, no masses or hernias. Non tender. Back: inspection of back is normal Extremities: no edema or lesions  Skin exam: normal coloration and turgor, no rashes, no suspicious skin lesions noted. Neurological exam reveals: alert, oriented, normal speech, no focal findings or movement disorder noted.  Pelvic: exam chaperoned by nurse;  Vulva: normal appearing vulva with no masses, tenderness or lesions; Vagina: normal; Cervix: healed well, no visible or palpable lesions; Adnexa: nontender, ; Uterus: uterus is normal size, shape, consistency and nontender;  Rectal: deferred.     Assessment:  Terry Walters is a 30 y.o. female with a history of CIN III s/p cone biopsy with positive ectocervical margin from 9-12 o'clock. She does not want to proceed with hysterectomy unless it is absolutely needed.  Although she had a positive ectocervical margin, it is possible that all the CIN III was all removed.   She has a lot going on now with her husband having brain cancer.  Last PAP and HR HPV in 9/16 normal.   She has UTI symptoms and has used Bactrim in the past.  Plan:   Problem List Items Addressed This Visit      Genitourinary   CIN III (cervical intraepithelial neoplasia III) - Primary     PAP done today.  She will RTC in 6 months for repeat PAP.  She is comfortable with the plan and had her questions answered.   Last visit we gave her a 7 day Rx for Bactrim DS bid for presumed UTI.  UA/C&S sent today and we will await the results before  prescribing antibiotic.  Mellody Drown, MD

## 2015-11-13 NOTE — Progress Notes (Signed)
  Oncology Nurse Navigator Documentation  Navigator Location: [facility] (11/13/15 1000) Navigator Encounter Type: [place] Follow-up (11/13/15 1000)                                          Time Spent with Patient: 30 (11/13/15 1000)   Urine culture collected and sent to lab. Chaperoned pap smear

## 2015-11-15 ENCOUNTER — Telehealth: Payer: Self-pay

## 2015-11-15 DIAGNOSIS — R300 Dysuria: Secondary | ICD-10-CM

## 2015-11-15 LAB — URINE CULTURE

## 2015-11-15 NOTE — Telephone Encounter (Signed)
  Oncology Nurse Navigator Documentation  Navigator Location: [facility] (11/15/15 0900) Navigator Encounter Type: Telephone;Diagnostic Results (11/15/15 0900)                                          Time Spent with Patient: 15 (11/15/15 0900)   Urine culture came back with multiple species present and suggested recollection. Patient notified and will come back today for recollection as well as U/A. Dr Fransisca Connors notified.

## 2015-11-18 LAB — PAP LB AND HPV HIGH-RISK
HPV, HIGH-RISK: NEGATIVE
PAP Smear Comment: 0

## 2016-02-03 ENCOUNTER — Emergency Department
Admission: EM | Admit: 2016-02-03 | Discharge: 2016-02-03 | Disposition: A | Discharge status: Home / Self Care | Payer: Medicaid Other | Attending: Emergency Medicine | Admitting: Emergency Medicine

## 2016-02-03 ENCOUNTER — Encounter: Payer: Self-pay | Admitting: *Deleted

## 2016-02-03 DIAGNOSIS — F319 Bipolar disorder, unspecified: Secondary | ICD-10-CM | POA: Diagnosis not present

## 2016-02-03 DIAGNOSIS — Z8541 Personal history of malignant neoplasm of cervix uteri: Secondary | ICD-10-CM | POA: Insufficient documentation

## 2016-02-03 DIAGNOSIS — N390 Urinary tract infection, site not specified: Secondary | ICD-10-CM | POA: Insufficient documentation

## 2016-02-03 DIAGNOSIS — Z8669 Personal history of other diseases of the nervous system and sense organs: Secondary | ICD-10-CM | POA: Diagnosis not present

## 2016-02-03 DIAGNOSIS — M1990 Unspecified osteoarthritis, unspecified site: Secondary | ICD-10-CM | POA: Diagnosis not present

## 2016-02-03 DIAGNOSIS — F17210 Nicotine dependence, cigarettes, uncomplicated: Secondary | ICD-10-CM | POA: Diagnosis not present

## 2016-02-03 DIAGNOSIS — R300 Dysuria: Secondary | ICD-10-CM | POA: Diagnosis present

## 2016-02-03 LAB — URINALYSIS COMPLETE WITH MICROSCOPIC (ARMC ONLY)
Bilirubin Urine: NEGATIVE
Glucose, UA: NEGATIVE mg/dL
Ketones, ur: NEGATIVE mg/dL
Nitrite: NEGATIVE
PROTEIN: 30 mg/dL — AB
Specific Gravity, Urine: 1.027 (ref 1.005–1.030)
pH: 8 (ref 5.0–8.0)

## 2016-02-03 MED ORDER — PHENAZOPYRIDINE HCL 100 MG PO TABS: 100.0000 mg | ORAL_TABLET | Freq: Three times a day (TID) | ORAL | Status: DC | PRN

## 2016-02-03 MED ORDER — SULFAMETHOXAZOLE-TRIMETHOPRIM 800-160 MG PO TABS: 1.0000 | ORAL_TABLET | Freq: Two times a day (BID) | ORAL | Status: DC

## 2016-02-03 NOTE — ED Notes (Signed)
States she has been having lower back pain for about 2 weeks  With urinary freq   States pain has increasdd over the past couple of days

## 2016-02-03 NOTE — ED Notes (Signed)
Pt to ED from home with urinary frequency, burning and bilateral lower back pain. Pt states, "I think I have a UTI, I get them all the time and this is exactly the same" Pt AAOx4 at this time, vitals stable. NAD noted.

## 2016-02-03 NOTE — ED Provider Notes (Signed)
[facility] Emergency Department Provider Note  ____________________________________________  Time seen: Approximately 11:50 AM  I have reviewed the triage vital signs and the nursing notes.   HISTORY  Chief Complaint Flank Pain and Urinary Frequency    HPI Terry Walters is a 30 y.o. female, NAD, presents to the emergency department for several week history of increased urinary frequency, dysuria and bilateral lower back pain. States she has had urinary tract infections in the past that began in a similar way. Has not noted any hematuria. Denies any fevers, chills, body aches. Does have some lower abdominal pressure that increases with urination. Denies any flank pain or wrapping of pain from her back to her abdomen. Has not had any nausea, vomiting, diarrhea. Has not taken anything over-the-counter for her symptoms.   Past Medical History  Diagnosis Date  . Cervical cancer ([place])   . Seizures ([place])   . Bipolar 1 disorder ([place])   . Arthritis   . IBS (irritable bowel syndrome)   . Acid reflux disease   . Panic attack     Patient Active Problem List   Diagnosis Date Noted  . Dysuria 05/15/2015  . CIN III (cervical intraepithelial neoplasia III) 01/23/2015    Past Surgical History  Procedure Laterality Date  . Biospy    . Cervical biopsy      Current Outpatient Rx  Name  Route  Sig  Dispense  Refill  . phenazopyridine (PYRIDIUM) 100 MG tablet   Oral   Take 1 tablet (100 mg total) by mouth 3 (three) times daily as needed for pain (May take 1-2 as needed three times daily).   9 tablet   0   . sulfamethoxazole-trimethoprim (BACTRIM DS,SEPTRA DS) 800-160 MG tablet   Oral   Take 1 tablet by mouth 2 (two) times daily.   14 tablet   0     Allergies Dilaudid; Morphine and related; and Paxil  Family History  Problem Relation Age of Onset  . COPD Father   . Heart disease Father   . Hypertension Maternal Grandfather   . Heart disease Maternal  Grandmother   . Diverticulosis Maternal Grandmother   . Stroke Maternal Aunt     Social History Social History  Substance Use Topics  . Smoking status: Current Every Day Smoker -- 2.00 packs/day for 15 years    Types: Cigarettes  . Smokeless tobacco: Former Systems developer    Quit date: 01/22/2005  . Alcohol Use: No     Review of Systems  Constitutional: No fever/chills Cardiovascular: No chest pain. Respiratory: No shortness of breath. No wheezing.  Gastrointestinal: Positive lower abdominal pressure.  No nausea, vomiting.  No diarrhea.  No constipation. Genitourinary: Positive for dysuria, increased urinary frequency. No hematuria. No urinary hesitancy, urgency. Musculoskeletal: Positive for back pain.  Skin: Negative for rash. Neurological: Negative for headaches, focal weakness or numbness. No saddle paraesthesias 10-point ROS otherwise negative.  ____________________________________________   PHYSICAL EXAM:  VITAL SIGNS: ED Triage Vitals  Enc Vitals Group     BP 02/03/16 1110 127/65 mmHg     Pulse Rate 02/03/16 1110 86     Resp 02/03/16 1110 18     Temp 02/03/16 1110 98.7 F (37.1 C)     Temp Source 02/03/16 1110 Oral     SpO2 02/03/16 1110 97 %     Weight 02/03/16 1110 150 lb (68.04 kg)     Height 02/03/16 1110 5\' 5"  (1.651 m)     Head Cir --  Peak Flow --      Pain Score 02/03/16 1114 9     Pain Loc --      Pain Edu? --      Excl. in [place]? --      Constitutional: Alert and oriented. Well appearing and in no acute distress. Eyes: Conjunctivae are normal.  Head: Atraumatic.  Neck: Supple with FROM.  Hematological/Lymphatic/Immunilogical: No cervical lymphadenopathy. Cardiovascular: Normal rate, regular rhythm. Normal S1 and S2.   Respiratory: Normal respiratory effort without tachypnea or retractions. Lungs CTAB with breath sounds noted in all lung fields. Gastrointestinal: Mild suprapubic tenderness to deep palpation causing sensation to urinate. All other  quadrants are soft and nontender without distention or guarding. No CVA tenderness. Musculoskeletal: No lower extremity tenderness nor edema.  No joint effusions. Neurologic:  Normal speech and language. No gross focal neurologic deficits are appreciated.  Skin:  Skin is warm, dry and intact. No rash noted. Psychiatric: Mood and affect are normal. Speech and behavior are normal. Patient exhibits appropriate insight and judgement.   ____________________________________________   LABS (all labs ordered are listed, but only abnormal results are displayed)  Labs Reviewed  URINALYSIS COMPLETEWITH MICROSCOPIC ([place]) - Abnormal; Notable for the following:    Color, Urine YELLOW (*)    APPearance CLEAR (*)    Hgb urine dipstick 1+ (*)    Protein, ur 30 (*)    Leukocytes, UA TRACE (*)    Bacteria, UA RARE (*)    Squamous Epithelial / LPF 0-5 (*)    All other components within normal limits   ____________________________________________  EKG  None ____________________________________________  RADIOLOGY  None ____________________________________________    PROCEDURES  Procedure(s) performed: None    Medications - No data to display   ____________________________________________   INITIAL IMPRESSION / ASSESSMENT AND PLAN / ED COURSE  Pertinent lab results that were available during my care of the patient were reviewed by me and considered in my medical decision making (see chart for details).  Patient's diagnosis is consistent with lower urinary tract infection. Patient will be discharged home with prescriptions for Pyridium and Bactrim to take as directed. Patient is to follow up with her primary care provider if symptoms persist past this treatment course. Patient is given ED precautions to return to the ED for any worsening or new symptoms.      ____________________________________________  FINAL CLINICAL IMPRESSION(S) / ED DIAGNOSES  Final diagnoses:   Lower urinary tract infection      NEW MEDICATIONS STARTED DURING THIS VISIT:  Discharge Medication List as of 02/03/2016 12:02 PM    START taking these medications   Details  phenazopyridine (PYRIDIUM) 100 MG tablet Take 1 tablet (100 mg total) by mouth 3 (three) times daily as needed for pain (May take 1-2 as needed three times daily)., Starting 02/03/2016, Until Discontinued, Print    sulfamethoxazole-trimethoprim (BACTRIM DS,SEPTRA DS) 800-160 MG tablet Take 1 tablet by mouth 2 (two) times daily., Starting 02/03/2016, Until Discontinued, [place], PA-C 02/03/16 [place], MD 02/03/16 1539

## 2016-02-03 NOTE — Discharge Instructions (Signed)

## 2016-03-18 ENCOUNTER — Emergency Department: Payer: No Typology Code available for payment source

## 2016-03-18 ENCOUNTER — Emergency Department
Admission: EM | Admit: 2016-03-18 | Discharge: 2016-03-18 | Disposition: A | Discharge status: Home / Self Care | Payer: No Typology Code available for payment source | Attending: Emergency Medicine | Admitting: Emergency Medicine

## 2016-03-18 ENCOUNTER — Encounter: Payer: Self-pay | Admitting: Emergency Medicine

## 2016-03-18 DIAGNOSIS — F17210 Nicotine dependence, cigarettes, uncomplicated: Secondary | ICD-10-CM | POA: Insufficient documentation

## 2016-03-18 DIAGNOSIS — Y999 Unspecified external cause status: Secondary | ICD-10-CM | POA: Diagnosis not present

## 2016-03-18 DIAGNOSIS — Y939 Activity, unspecified: Secondary | ICD-10-CM | POA: Insufficient documentation

## 2016-03-18 DIAGNOSIS — Y92410 Unspecified street and highway as the place of occurrence of the external cause: Secondary | ICD-10-CM | POA: Insufficient documentation

## 2016-03-18 DIAGNOSIS — Z8541 Personal history of malignant neoplasm of cervix uteri: Secondary | ICD-10-CM | POA: Insufficient documentation

## 2016-03-18 DIAGNOSIS — S39012A Strain of muscle, fascia and tendon of lower back, initial encounter: Secondary | ICD-10-CM | POA: Diagnosis not present

## 2016-03-18 DIAGNOSIS — S161XXA Strain of muscle, fascia and tendon at neck level, initial encounter: Secondary | ICD-10-CM | POA: Diagnosis not present

## 2016-03-18 DIAGNOSIS — S199XXA Unspecified injury of neck, initial encounter: Secondary | ICD-10-CM | POA: Diagnosis present

## 2016-03-18 LAB — POCT PREGNANCY, URINE: Preg Test, Ur: NEGATIVE

## 2016-03-18 MED ORDER — METHOCARBAMOL 750 MG PO TABS: 750.0000 mg | ORAL_TABLET | Freq: Four times a day (QID) | ORAL | 0 refills | Status: DC

## 2016-03-18 MED ORDER — IBUPROFEN 600 MG PO TABS: 600.0000 mg | ORAL_TABLET | Freq: Three times a day (TID) | ORAL | 0 refills | Status: DC | PRN

## 2016-03-18 MED ORDER — OXYCODONE-ACETAMINOPHEN 5-325 MG PO TABS: 1.0000 | ORAL_TABLET | Freq: Four times a day (QID) | ORAL | 0 refills | Status: DC | PRN

## 2016-03-18 MED ORDER — OXYCODONE-ACETAMINOPHEN 5-325 MG PO TABS
1.0000 | ORAL_TABLET | Freq: Once | ORAL | Status: AC
  Administered 2016-03-18: 1 via ORAL
  Filled 2016-03-18: qty 1

## 2016-03-18 MED ORDER — METHOCARBAMOL 500 MG PO TABS
1000.0000 mg | ORAL_TABLET | Freq: Once | ORAL | Status: AC
  Administered 2016-03-18: 1000 mg via ORAL
  Filled 2016-03-18: qty 2
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
## 2016-04-05 ENCOUNTER — Emergency Department: Payer: Medicaid Other

## 2016-04-05 ENCOUNTER — Emergency Department
Admission: EM | Admit: 2016-04-05 | Discharge: 2016-04-05 | Disposition: A | Discharge status: Home / Self Care | Payer: Medicaid Other | Attending: Emergency Medicine | Admitting: Emergency Medicine

## 2016-04-05 DIAGNOSIS — R102 Pelvic and perineal pain: Secondary | ICD-10-CM | POA: Insufficient documentation

## 2016-04-05 DIAGNOSIS — Z8541 Personal history of malignant neoplasm of cervix uteri: Secondary | ICD-10-CM | POA: Insufficient documentation

## 2016-04-05 DIAGNOSIS — F17210 Nicotine dependence, cigarettes, uncomplicated: Secondary | ICD-10-CM | POA: Insufficient documentation

## 2016-04-05 DIAGNOSIS — N949 Unspecified condition associated with female genital organs and menstrual cycle: Secondary | ICD-10-CM

## 2016-04-05 DIAGNOSIS — R300 Dysuria: Secondary | ICD-10-CM | POA: Diagnosis present

## 2016-04-05 LAB — URINALYSIS COMPLETE WITH MICROSCOPIC (ARMC ONLY)
Bacteria, UA: NONE SEEN
Bilirubin Urine: NEGATIVE
GLUCOSE, UA: NEGATIVE mg/dL
KETONES UR: NEGATIVE mg/dL
Leukocytes, UA: NEGATIVE
NITRITE: NEGATIVE
Protein, ur: 30 mg/dL — AB
SPECIFIC GRAVITY, URINE: 1.024 (ref 1.005–1.030)
pH: 7 (ref 5.0–8.0)

## 2016-04-05 LAB — COMPREHENSIVE METABOLIC PANEL
ALK PHOS: 94 U/L (ref 38–126)
ALT: 28 U/L (ref 14–54)
ANION GAP: 4 — AB (ref 5–15)
AST: 23 U/L (ref 15–41)
Albumin: 4.6 g/dL (ref 3.5–5.0)
BILIRUBIN TOTAL: 0.4 mg/dL (ref 0.3–1.2)
BUN: 14 mg/dL (ref 6–20)
CALCIUM: 9.3 mg/dL (ref 8.9–10.3)
CO2: 27 mmol/L (ref 22–32)
CREATININE: 0.71 mg/dL (ref 0.44–1.00)
Chloride: 106 mmol/L (ref 101–111)
GFR calc non Af Amer: >60 mL/min (ref >60)
Glucose, Bld: 95 mg/dL (ref 65–99)
Potassium: 4.2 mmol/L (ref 3.5–5.1)
Sodium: 137 mmol/L (ref 135–145)
TOTAL PROTEIN: 7.8 g/dL (ref 6.5–8.1)

## 2016-04-05 LAB — WET PREP, GENITAL
CLUE CELLS WET PREP: NONE SEEN
SPERM: NONE SEEN
TRICH WET PREP: NONE SEEN
WBC WET PREP: NONE SEEN
YEAST WET PREP: NONE SEEN

## 2016-04-05 LAB — LIPASE, BLOOD: Lipase: 30 U/L (ref 11–51)

## 2016-04-05 LAB — CBC
HCT: 44.4 % (ref 35.0–47.0)
HEMOGLOBIN: 15.2 g/dL (ref 12.0–16.0)
MCH: 29.8 pg (ref 26.0–34.0)
MCHC: 34.1 g/dL (ref 32.0–36.0)
MCV: 87.3 fL (ref 80.0–100.0)
PLATELETS: 249 10*3/uL (ref 150–440)
RBC: 5.09 MIL/uL (ref 3.80–5.20)
RDW: 12.4 % (ref 11.5–14.5)
WBC: 20.4 10*3/uL — ABNORMAL HIGH (ref 3.6–11.0)

## 2016-04-05 LAB — CHLAMYDIA/NGC RT PCR (ARMC ONLY)
Chlamydia Tr: NOT DETECTED
N GONORRHOEAE: NOT DETECTED

## 2016-04-05 LAB — POCT PREGNANCY, URINE: PREG TEST UR: NEGATIVE

## 2016-04-05 NOTE — ED Provider Notes (Signed)
[facility] Emergency Department Provider Note   ____________________________________________    I have reviewed the triage vital signs and the nursing notes.   HISTORY  Chief Complaint Vaginal Discharge and Recurrent UTI     HPI Terry Walters is a 30 y.o. female presents with complaints of dysuria. Patient reports she is having dysuria and notes that she has had this several times over the last month and has been treated multiple times for urinary tract infection. Her symptoms seem to get better but then they become worse again. She complains of mild superpubic discomfort. No fevers or chills. No nausea or vomiting. She does report foul-smelling vaginal discharge.   Past Medical History:  Diagnosis Date  . Acid reflux disease   . Arthritis   . Bipolar 1 disorder ([place])   . Cervical cancer ([place])   . IBS (irritable bowel syndrome)   . Panic attack   . Seizures [facility])     Patient Active Problem List   Diagnosis Date Noted  . Dysuria 05/15/2015  . CIN III (cervical intraepithelial neoplasia III) 01/23/2015    Past Surgical History:  Procedure Laterality Date  . biospy    . CERVICAL BIOPSY      Prior to Admission medications   Medication Sig Start Date End Date Taking? Authorizing Provider  ibuprofen (ADVIL,MOTRIN) 600 MG tablet Take 1 tablet (600 mg total) by mouth every 8 (eight) hours as needed. 03/18/16   Sable Feil, PA-C  methocarbamol (ROBAXIN-750) 750 MG tablet Take 1 tablet (750 mg total) by mouth 4 (four) times daily. 03/18/16   Sable Feil, PA-C  oxyCODONE-acetaminophen (ROXICET) 5-325 MG tablet Take 1 tablet by mouth every 6 (six) hours as needed. 03/18/16 03/18/17  Sable Feil, PA-C  phenazopyridine (PYRIDIUM) 100 MG tablet Take 1 tablet (100 mg total) by mouth 3 (three) times daily as needed for pain (May take 1-2 as needed three times daily). 02/03/16   Jami L Hagler, PA-C  sulfamethoxazole-trimethoprim (BACTRIM DS,SEPTRA DS)  800-160 MG tablet Take 1 tablet by mouth 2 (two) times daily. 02/03/16   Jami L Hagler, PA-C     Allergies Dilaudid [hydromorphone hcl]; Morphine and related; and Paxil [paroxetine hcl]  Family History  Problem Relation Age of Onset  . COPD Father   . Heart disease Father   . Hypertension Maternal Grandfather   . Heart disease Maternal Grandmother   . Diverticulosis Maternal Grandmother   . Stroke Maternal Aunt     Social History Social History  Substance Use Topics  . Smoking status: Current Every Day Smoker    Packs/day: 2.00    Years: 15.00    Types: Cigarettes  . Smokeless tobacco: Former Systems developer    Quit date: 01/22/2005  . Alcohol use No    Review of Systems  Constitutional: No fever/chills Eyes: No Discharge ENT: No sore throat. Cardiovascular: Denies chest pain. Respiratory: Denies shortness of breath. Gastrointestinal:  No nausea, no vomiting.   Genitourinary: As above Musculoskeletal: Negative for back pain. Skin: Negative for rash. Neurological: Negative for headaches or weakness  10-point ROS otherwise negative.  ____________________________________________   PHYSICAL EXAM:  VITAL SIGNS: ED Triage Vitals  Enc Vitals Group     BP 04/05/16 1239 119/66     Pulse Rate 04/05/16 1239 83     Resp 04/05/16 1418 15     Temp 04/05/16 1239 98.8 F (37.1 C)     Temp Source 04/05/16 1239 Oral  SpO2 04/05/16 1239 98 %     Weight 04/05/16 1239 165 lb (74.8 kg)     Height 04/05/16 1239 5\' 5"  (1.651 m)     Head Circumference --      Peak Flow --      Pain Score --      Pain Loc --      Pain Edu? --      Excl. in [place]? --     Constitutional: Alert and oriented. No acute distress. Pleasant and interactive Eyes: Conjunctivae are normal.  Head: Atraumatic. Nose: No congestion/rhinnorhea. Mouth/Throat: Mucous membranes are moist.   Neck:  Painless ROM Cardiovascular: Normal rate, regular rhythm. Grossly normal heart sounds.   Respiratory: Normal respiratory  effort.  No retractions. Lungs CTAB. Gastrointestinal: Soft and nontender. No distention.  No CVA tenderness. Genitourinary:Mild CMT, no significant discharge Musculoskeletal: No lower extremity tenderness nor edema.  Warm and well perfused Neurologic:  Normal speech and language. No gross focal neurologic deficits are appreciated.  Skin:  Skin is warm, dry and intact. No rash noted. Psychiatric: Mood and affect are normal. Speech and behavior are normal.  ____________________________________________   LABS (all labs ordered are listed, but only abnormal results are displayed)  Labs Reviewed  COMPREHENSIVE METABOLIC PANEL - Abnormal; Notable for the following:       Result Value   Anion gap 4 (*)    All other components within normal limits  CBC - Abnormal; Notable for the following:    WBC 20.4 (*)    All other components within normal limits  URINALYSIS COMPLETEWITH MICROSCOPIC ([facility] ONLY) - Abnormal; Notable for the following:    Color, Urine YELLOW (*)    APPearance CLEAR (*)    Hgb urine dipstick 1+ (*)    Protein, ur 30 (*)    Squamous Epithelial / LPF 0-5 (*)    All other components within normal limits  CHLAMYDIA/NGC RT PCR ([facility] ONLY)  WET PREP, GENITAL  LIPASE, BLOOD  POC URINE PREG, ED  POCT PREGNANCY, URINE   ____________________________________________  EKG  None ____________________________________________  RADIOLOGY  Ultrasound unremarkable ____________________________________________   PROCEDURES  Procedure(s) performed: No    Critical Care performed: No ____________________________________________   INITIAL IMPRESSION / ASSESSMENT AND PLAN / ED COURSE  Pertinent labs & imaging results that were available during my care of the patient were reviewed by me and considered in my medical decision making (see chart for details).  Patient presents with complaints of dysuria/vaginal discharge. Urinalysis is unremarkable. I'm suspicious  and bacterial vaginosis. Pending the exam  Clinical Course  Patient with elevated white blood cell count of unknown origin. Otherwise labs are benign. Urinalysis is normal. Ultrasound is normal. Pelvic exam is unremarkable. Wet prep is normal. GC chlamydia are negative. I discussed with patient my recommendation for a CT scan. The patient reports that she is feeling better and thus would like to decline further workup, she would like to follow up with her PCP. She knows she can return any time if worsening symptoms ____________________________________________   FINAL CLINICAL IMPRESSION(S) / ED DIAGNOSES  Final diagnoses:  Cervical motion tenderness  Pelvic cramping      NEW MEDICATIONS STARTED DURING THIS VISIT:  New Prescriptions   No medications on file     Note:  This document was prepared using Dragon voice recognition software and may include unintentional dictation errors.    Lavonia Drafts, MD 04/05/16 [phone]

## 2016-04-05 NOTE — ED Triage Notes (Signed)
Pt reports burning with urination along with lower and upper abd stabbing pain. Pt reports this is the 3rd UTI in one month. Denies fever. Reports headache. Denies nausea, vomiting and diarrhea. Reports vaginal discharge and itching for past 4 weeks. Yellow with odor.

## 2016-05-13 ENCOUNTER — Inpatient Hospital Stay: Payer: Medicaid Other | Attending: Obstetrics and Gynecology | Admitting: Obstetrics and Gynecology

## 2016-05-13 ENCOUNTER — Encounter (INDEPENDENT_AMBULATORY_CARE_PROVIDER_SITE_OTHER): Payer: Self-pay

## 2016-05-13 ENCOUNTER — Encounter: Payer: Self-pay | Admitting: Obstetrics and Gynecology

## 2016-05-13 VITALS — BP 124/81 | HR 80 | Temp 97.0°F | Ht 65.0 in | Wt 172.0 lb

## 2016-05-13 DIAGNOSIS — N920 Excessive and frequent menstruation with regular cycle: Secondary | ICD-10-CM | POA: Diagnosis not present

## 2016-05-13 DIAGNOSIS — F819 Developmental disorder of scholastic skills, unspecified: Secondary | ICD-10-CM | POA: Diagnosis not present

## 2016-05-13 DIAGNOSIS — K219 Gastro-esophageal reflux disease without esophagitis: Secondary | ICD-10-CM | POA: Diagnosis not present

## 2016-05-13 DIAGNOSIS — K589 Irritable bowel syndrome without diarrhea: Secondary | ICD-10-CM | POA: Insufficient documentation

## 2016-05-13 DIAGNOSIS — D069 Carcinoma in situ of cervix, unspecified: Secondary | ICD-10-CM | POA: Diagnosis not present

## 2016-05-13 DIAGNOSIS — R569 Unspecified convulsions: Secondary | ICD-10-CM | POA: Insufficient documentation

## 2016-05-13 DIAGNOSIS — F319 Bipolar disorder, unspecified: Secondary | ICD-10-CM | POA: Diagnosis not present

## 2016-05-13 DIAGNOSIS — M1990 Unspecified osteoarthritis, unspecified site: Secondary | ICD-10-CM | POA: Insufficient documentation

## 2016-05-13 DIAGNOSIS — F17210 Nicotine dependence, cigarettes, uncomplicated: Secondary | ICD-10-CM

## 2016-05-13 DIAGNOSIS — F17200 Nicotine dependence, unspecified, uncomplicated: Secondary | ICD-10-CM | POA: Insufficient documentation

## 2016-05-13 NOTE — Progress Notes (Signed)
Patient here for follow up. No changes since last appointment.  

## 2016-05-13 NOTE — Patient Instructions (Signed)
Smoking Cessation, Tips for Success If you are ready to quit smoking, congratulations! You have chosen to help yourself be healthier. Cigarettes bring nicotine, tar, carbon monoxide, and other irritants into your body. Your lungs, heart, and blood vessels will be able to work better without these poisons. There are many different ways to quit smoking. Nicotine gum, nicotine patches, a nicotine inhaler, or nicotine nasal spray can help with physical craving. Hypnosis, support groups, and medicines help break the habit of smoking. WHAT THINGS CAN I DO TO MAKE QUITTING EASIER?  Here are some tips to help you quit for good:  Pick a date when you will quit smoking completely. Tell all of your friends and family about your plan to quit on that date.  Do not try to slowly cut down on the number of cigarettes you are smoking. Pick a quit date and quit smoking completely starting on that day.  Throw away all cigarettes.   Clean and remove all ashtrays from your home, work, and car.  On a card, write down your reasons for quitting. Carry the card with you and read it when you get the urge to smoke.  Cleanse your body of nicotine. Drink enough water and fluids to keep your urine clear or pale yellow. Do this after quitting to flush the nicotine from your body.  Learn to predict your moods. Do not let a bad situation be your excuse to have a cigarette. Some situations in your life might tempt you into wanting a cigarette.  Never have "just one" cigarette. It leads to wanting another and another. Remind yourself of your decision to quit.  Change habits associated with smoking. If you smoked while driving or when feeling stressed, try other activities to replace smoking. Stand up when drinking your coffee. Brush your teeth after eating. Sit in a different chair when you read the paper. Avoid alcohol while trying to quit, and try to drink fewer caffeinated beverages. Alcohol and caffeine may urge you to  smoke.  Avoid foods and drinks that can trigger a desire to smoke, such as sugary or spicy foods and alcohol.  Ask people who smoke not to smoke around you.  Have something planned to do right after eating or having a cup of coffee. For example, plan to take a walk or exercise.  Try a relaxation exercise to calm you down and decrease your stress. Remember, you may be tense and nervous for the first 2 weeks after you quit, but this will pass.  Find new activities to keep your hands busy. Play with a pen, coin, or rubber band. Doodle or draw things on paper.  Brush your teeth right after eating. This will help cut down on the craving for the taste of tobacco after meals. You can also try mouthwash.   Use oral substitutes in place of cigarettes. Try using lemon drops, carrots, cinnamon sticks, or chewing gum. Keep them handy so they are available when you have the urge to smoke.  When you have the urge to smoke, try deep breathing.  Designate your home as a nonsmoking area.  If you are a heavy smoker, ask your health care provider about a prescription for nicotine chewing gum. It can ease your withdrawal from nicotine.  Reward yourself. Set aside the cigarette money you save and buy yourself something nice.  Look for support from others. Join a support group or smoking cessation program. Ask someone at home or at work to help you with your plan   to quit smoking.  Always ask yourself, "Do I need this cigarette or is this just a reflex?" Tell yourself, "Today, I choose not to smoke," or "I do not want to smoke." You are reminding yourself of your decision to quit.  Do not replace cigarette smoking with electronic cigarettes (commonly called e-cigarettes). The safety of e-cigarettes is unknown, and some may contain harmful chemicals.  If you relapse, do not give up! Plan ahead and think about what you will do the next time you get the urge to smoke. HOW WILL I FEEL WHEN I QUIT SMOKING? You  may have symptoms of withdrawal because your body is used to nicotine (the addictive substance in cigarettes). You may crave cigarettes, be irritable, feel very hungry, cough often, get headaches, or have difficulty concentrating. The withdrawal symptoms are only temporary. They are strongest when you first quit but will go away within 10-14 days. When withdrawal symptoms occur, stay in control. Think about your reasons for quitting. Remind yourself that these are signs that your body is healing and getting used to being without cigarettes. Remember that withdrawal symptoms are easier to treat than the major diseases that smoking can cause.  Even after the withdrawal is over, expect periodic urges to smoke. However, these cravings are generally short lived and will go away whether you smoke or not. Do not smoke! WHAT RESOURCES ARE AVAILABLE TO HELP ME QUIT SMOKING? Your health care provider can direct you to community resources or hospitals for support, which may include:  Group support.  Education.  Hypnosis.  Therapy.   This information is not intended to replace advice given to you by your health care provider. Make sure you discuss any questions you have with your health care provider.   Document Released: 05/01/2004 Document Revised: 08/24/2014 Document Reviewed: 01/19/2013 Elsevier Interactive Patient Education 2016 [employer]  
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
## 2016-05-18 NOTE — Progress Notes (Signed)
  Oncology Nurse Navigator Documentation Inquiry sent to Dr. Janice Norrie referring physician regarding accepting patient back if 10/2016 co-testing pap/hpv are negative.  Navigator Location: [facility] (05/18/16 1600) Navigator Encounter Type: Letter/Fax/Email (05/18/16 1600)                                          Time Spent with Patient: 15 (05/18/16 1600)

## 2016-05-28 ENCOUNTER — Emergency Department: Payer: Medicaid Other

## 2016-05-28 ENCOUNTER — Emergency Department
Admission: EM | Admit: 2016-05-28 | Discharge: 2016-05-28 | Disposition: A | Discharge status: Home / Self Care | Payer: Medicaid Other | Attending: Emergency Medicine | Admitting: Emergency Medicine

## 2016-05-28 ENCOUNTER — Encounter: Payer: Self-pay | Admitting: Emergency Medicine

## 2016-05-28 DIAGNOSIS — Z8541 Personal history of malignant neoplasm of cervix uteri: Secondary | ICD-10-CM | POA: Diagnosis not present

## 2016-05-28 DIAGNOSIS — R102 Pelvic and perineal pain: Secondary | ICD-10-CM | POA: Insufficient documentation

## 2016-05-28 DIAGNOSIS — F17210 Nicotine dependence, cigarettes, uncomplicated: Secondary | ICD-10-CM | POA: Diagnosis not present

## 2016-05-28 DIAGNOSIS — Z791 Long term (current) use of non-steroidal anti-inflammatories (NSAID): Secondary | ICD-10-CM | POA: Insufficient documentation

## 2016-05-28 DIAGNOSIS — R1030 Lower abdominal pain, unspecified: Secondary | ICD-10-CM | POA: Diagnosis present

## 2016-05-28 DIAGNOSIS — Z79899 Other long term (current) drug therapy: Secondary | ICD-10-CM | POA: Diagnosis not present

## 2016-05-28 LAB — COMPREHENSIVE METABOLIC PANEL
ALBUMIN: 4.1 g/dL (ref 3.5–5.0)
ALK PHOS: 100 U/L (ref 38–126)
ALT: 22 U/L (ref 14–54)
ANION GAP: 7 (ref 5–15)
AST: 22 U/L (ref 15–41)
BUN: 12 mg/dL (ref 6–20)
CHLORIDE: 102 mmol/L (ref 101–111)
CO2: 23 mmol/L (ref 22–32)
Calcium: 9.2 mg/dL (ref 8.9–10.3)
Creatinine, Ser: 0.81 mg/dL (ref 0.44–1.00)
GFR calc non Af Amer: >60 mL/min (ref >60)
Glucose, Bld: 122 mg/dL — ABNORMAL HIGH (ref 65–99)
POTASSIUM: 4 mmol/L (ref 3.5–5.1)
SODIUM: 132 mmol/L — AB (ref 135–145)
TOTAL PROTEIN: 7.6 g/dL (ref 6.5–8.1)
Total Bilirubin: 0.5 mg/dL (ref 0.3–1.2)

## 2016-05-28 LAB — LIPASE, BLOOD: Lipase: 28 U/L (ref 11–51)

## 2016-05-28 LAB — URINALYSIS COMPLETE WITH MICROSCOPIC (ARMC ONLY)
Bilirubin Urine: NEGATIVE
GLUCOSE, UA: NEGATIVE mg/dL
Ketones, ur: NEGATIVE mg/dL
LEUKOCYTES UA: NEGATIVE
NITRITE: NEGATIVE
PH: 8 (ref 5.0–8.0)
Protein, ur: 30 mg/dL — AB
SPECIFIC GRAVITY, URINE: 1.021 (ref 1.005–1.030)

## 2016-05-28 LAB — CBC
HEMATOCRIT: 41.7 % (ref 35.0–47.0)
HEMOGLOBIN: 14.8 g/dL (ref 12.0–16.0)
MCH: 30.9 pg (ref 26.0–34.0)
MCHC: 35.6 g/dL (ref 32.0–36.0)
MCV: 86.9 fL (ref 80.0–100.0)
Platelets: 206 10*3/uL (ref 150–440)
RBC: 4.8 MIL/uL (ref 3.80–5.20)
RDW: 12.5 % (ref 11.5–14.5)
WBC: 17.4 10*3/uL — AB (ref 3.6–11.0)

## 2016-05-28 LAB — POCT PREGNANCY, URINE: PREG TEST UR: NEGATIVE

## 2016-05-28 MED ORDER — IOPAMIDOL (ISOVUE-300) INJECTION 61%
100.0000 mL | Freq: Once | INTRAVENOUS | Status: AC | PRN
  Administered 2016-05-28: 100 mL via INTRAVENOUS

## 2016-05-28 MED ORDER — IOPAMIDOL (ISOVUE-300) INJECTION 61%
30.0000 mL | Freq: Once | INTRAVENOUS | Status: AC | PRN
  Administered 2016-05-28: 30 mL via ORAL

## 2016-05-28 NOTE — ED Provider Notes (Addendum)
[facility] Emergency Department Provider Note  ____________________________________________   I have reviewed the triage vital signs and the nursing notes.   HISTORY  Chief Complaint Abdominal Pain    HPI Terry Walters is a 30 y.o. female presents today with lower abdominal discomfort. Patient states she's been having stomach pains for over 2 weeks. She was seen here for thissame pain she says "2 weeks ago" that actually the last time we saw her was on August 20. She states she's had intermittent cramping since that time as been worse over the last 2 weeks. She denies any vaginal discharge. She does have a history of what she describes as cervical cancer in situ, with no known metastatic disease no indication for chemotherapy or radiation. More recent notes from an office visit from oncology suggested the patient had cervical dysplasia with a cone with positive ectocervical margin. Patient does have a regular menstrual periods her last missed her period was September 28 of this year. She has not had any recent vaginal bleeding. She denies any vaginal discharge or STI symptoms. She has pain from her belly button down and apparently she states that if she was to have ongoing symptoms her oncologist told her to come to the emergency room for a CT scan. He states he has a cramping lower abdominal pain that was made worse yesterday when she started having diarrhea. Up to that time her bowel movements have been totally normal. She denies any vomiting or fever dysuria or urinary frequency or back pain or flank pain. When she was last here, she did have an unremarkable ultrasound for similar complaints.   Past Medical History:  Diagnosis Date  . Acid reflux disease   . Arthritis   . Bipolar 1 disorder ([place])   . Cervical cancer ([place])   . IBS (irritable bowel syndrome)   . Panic attack   . Seizures [facility])     Patient Active Problem List   Diagnosis Date Noted  . Tobacco  use disorder 05/13/2016  . Dysuria 05/15/2015  . Cervical intraepithelial neoplasia grade III with severe dysplasia 01/23/2015    Past Surgical History:  Procedure Laterality Date  . biospy    . CERVICAL BIOPSY      Prior to Admission medications   Medication Sig Start Date End Date Taking? Authorizing Provider  ibuprofen (ADVIL,MOTRIN) 600 MG tablet Take 1 tablet (600 mg total) by mouth every 8 (eight) hours as needed. Patient not taking: Reported on 05/28/2016 03/18/16   Sable Feil, PA-C  methocarbamol (ROBAXIN-750) 750 MG tablet Take 1 tablet (750 mg total) by mouth 4 (four) times daily. Patient not taking: Reported on 05/28/2016 03/18/16   Sable Feil, PA-C  phenazopyridine (PYRIDIUM) 100 MG tablet Take 1 tablet (100 mg total) by mouth 3 (three) times daily as needed for pain (May take 1-2 as needed three times daily). Patient not taking: Reported on 05/28/2016 02/03/16   Jami L Hagler, PA-C    Allergies Dilaudid [hydromorphone hcl]; Morphine and related; and Paxil [paroxetine hcl]  Family History  Problem Relation Age of Onset  . COPD Father   . Heart disease Father   . Hypertension Maternal Grandfather   . Heart disease Maternal Grandmother   . Diverticulosis Maternal Grandmother   . Stroke Maternal Aunt     Social History Social History  Substance Use Topics  . Smoking status: Current Every Day Smoker    Packs/day: 2.00    Years: 15.00  Types: Cigarettes  . Smokeless tobacco: Former Systems developer    Quit date: 01/22/2005  . Alcohol use No    Review of Systems Constitutional: No fever/chills Eyes: No visual changes. ENT: No sore throat. No stiff neck no neck pain Cardiovascular: Denies chest pain. Respiratory: Denies shortness of breath. Gastrointestinal:   no vomiting.  No diarrhea.  No constipation. Genitourinary: Negative for dysuria. Musculoskeletal: Negative lower extremity swelling Skin: Negative for rash. Neurological: Negative for severe headaches, focal  weakness or numbness. 10-point ROS otherwise negative.  ____________________________________________   PHYSICAL EXAM:  VITAL SIGNS: ED Triage Vitals  Enc Vitals Group     BP 05/28/16 1050 135/75     Pulse Rate 05/28/16 1050 100     Resp 05/28/16 1050 20     Temp 05/28/16 1050 99.1 F (37.3 C)     Temp Source 05/28/16 1050 Oral     SpO2 05/28/16 1050 97 %     Weight 05/28/16 1051 170 lb (77.1 kg)     Height 05/28/16 1051 5\' 5"  (1.651 m)     Head Circumference --      Peak Flow --      Pain Score 05/28/16 1058 9     Pain Loc --      Pain Edu? --      Excl. in [place]? --     Constitutional: Alert and oriented. Well appearing and in no acute distress. Eyes: Conjunctivae are normal. PERRL. EOMI. Head: Atraumatic. Nose: No congestion/rhinnorhea. Mouth/Throat: Mucous membranes are moist.  Oropharynx non-erythematous. Neck: No stridor.   Nontender with no meningismus Cardiovascular: Normal rate, regular rhythm. Grossly normal heart sounds.  Good peripheral circulation. Respiratory: Normal respiratory effort.  No retractions. Lungs CTAB. Abdominal: Soft and Slight lower abdominal discomfort, no focality. No distention. No guarding no rebound Back:  There is no focal tenderness or step off.  there is no midline tenderness there are no lesions noted. there is no CVA tenderness Musculoskeletal: No lower extremity tenderness, no upper extremity tenderness. No joint effusions, no DVT signs strong distal pulses no edema Neurologic:  Normal speech and language. No gross focal neurologic deficits are appreciated.  Skin:  Skin is warm, dry and intact. No rash noted. Psychiatric: Mood and affect are normal. Speech and behavior are normal.  ____________________________________________   LABS (all labs ordered are listed, but only abnormal results are displayed)  Labs Reviewed  COMPREHENSIVE METABOLIC PANEL - Abnormal; Notable for the following:       Result Value   Sodium 132 (*)     Glucose, Bld 122 (*)    All other components within normal limits  CBC - Abnormal; Notable for the following:    WBC 17.4 (*)    All other components within normal limits  URINALYSIS COMPLETEWITH MICROSCOPIC ([facility] ONLY) - Abnormal; Notable for the following:    Color, Urine YELLOW (*)    APPearance CLEAR (*)    Hgb urine dipstick 1+ (*)    Protein, ur 30 (*)    Bacteria, UA RARE (*)    Squamous Epithelial / LPF 0-5 (*)    All other components within normal limits  LIPASE, BLOOD  POC URINE PREG, ED  POCT PREGNANCY, URINE   ____________________________________________  EKG  I personally interpreted any EKGs ordered by me or triage Normal sinus rhythm rate 89 bpm no acute ST elevation or acute ST depression normal axis unremarkable EKG ____________________________________________  RADIOLOGY  I reviewed any imaging ordered by me or triage that were  performed during my shift and, if possible, patient and/or family made aware of any abnormal findings. ____________________________________________   PROCEDURES  Procedure(s) performed: None  Procedures  Critical Care performed: None  ____________________________________________   INITIAL IMPRESSION / ASSESSMENT AND PLAN / ED COURSE  Pertinent labs & imaging results that were available during my care of the patient were reviewed by me and considered in my medical decision making (see chart for details).  Patient with lower abdominal discomfort off and on since August 20. She is sent here apparently by her oncologist for a CT scan. We'll obtain a CT scan although at low suspicion of any acute intra-abdominal pathology and given her cancer history there is certainly low risk for metastatic disease. She is otherwise benign in appearance. We will offer her pelvic exam. He has no STI symptoms however.  ----------------------------------------- 2:16 PM on 05/28/2016 -----------------------------------------  Patient states she  is "starving" and wants to go home. It did offer and advise a pelvic exam but she refuses. She understands limitation this places upon the. Personally have low suspicion for PID given that she has had pain now for nearly 2 months. CT scan shows what appears to be possible pelvic congestion syndrome. Patient is a [facility] patient we'll call them and have her follow-up as an outpatient. She declines any pain medication. She understands that without pelvic exam I certainly cannot rule out infection irrespective of the degree of suspicion that we have. D/w. Dr. Georgianne Fick who agrees w/ mgt and f/u.  Clinical Course   ____________________________________________   FINAL CLINICAL IMPRESSION(S) / ED DIAGNOSES  Final diagnoses:  None      This chart was dictated using voice recognition software.  Despite best efforts to proofread,  errors can occur which can change meaning.      Schuyler Amor, MD 05/28/16 [place], MD 05/28/16 [place], MD 05/28/16 1443    Schuyler Amor, MD 05/28/16 [place], MD 05/28/16 [phone]

## 2016-05-28 NOTE — ED Notes (Signed)
PT refused pelvic exam, states she will just have PCP do it

## 2016-05-28 NOTE — ED Notes (Signed)
Patient transported to CT 

## 2016-05-28 NOTE — Discharge Instructions (Signed)
It appears you may have pelvic congestion syndrome. As we discussed, without pelvic exam, there are some things we cannot identify, but that is what your ct is showing. This will require close outpatient follow up with OB GYN.  Please call you ob today for appointment. If you have increased pain, fever, vomiting, you change your mind about having a pelvic exam, or you feel worse in any way, return to the emergency department.

## 2016-05-28 NOTE — ED Triage Notes (Signed)
Says stomach issues for more than 1 week.   Saw her cancer doctor and had normal labs and then they told her to come here today for a ct.

## 2016-07-28 ENCOUNTER — Encounter: Payer: Self-pay | Admitting: Emergency Medicine

## 2016-07-28 ENCOUNTER — Emergency Department
Admission: EM | Admit: 2016-07-28 | Discharge: 2016-07-28 | Disposition: A | Discharge status: Home / Self Care | Payer: Medicaid Other | Attending: Emergency Medicine | Admitting: Emergency Medicine

## 2016-07-28 DIAGNOSIS — J069 Acute upper respiratory infection, unspecified: Secondary | ICD-10-CM | POA: Diagnosis not present

## 2016-07-28 DIAGNOSIS — Z8541 Personal history of malignant neoplasm of cervix uteri: Secondary | ICD-10-CM | POA: Diagnosis not present

## 2016-07-28 DIAGNOSIS — B9789 Other viral agents as the cause of diseases classified elsewhere: Secondary | ICD-10-CM

## 2016-07-28 DIAGNOSIS — F17210 Nicotine dependence, cigarettes, uncomplicated: Secondary | ICD-10-CM | POA: Diagnosis not present

## 2016-07-28 DIAGNOSIS — J029 Acute pharyngitis, unspecified: Secondary | ICD-10-CM | POA: Diagnosis present

## 2016-07-28 MED ORDER — GUAIFENESIN-CODEINE 100-10 MG/5ML PO SYRP: 5.0000 mL | ORAL_SOLUTION | Freq: Three times a day (TID) | ORAL | 0 refills | Status: DC | PRN

## 2016-07-28 NOTE — ED Provider Notes (Signed)
[facility] Emergency Department Provider Note  ____________________________________________  Time seen: Approximately 12:40 PM  I have reviewed the triage vital signs and the nursing notes.   HISTORY  Chief Complaint Sore Throat   HPI Terry Walters is a 30 y.o. female presents to the emergency department for evaluation of URI symptoms. Symptoms have been present for the past 4 days. She has a sore throat, earache, and cough. Cough keeps her awake at night. She has had no relief with over-the-counter cough and cold medications. She denies a history of any chronic illness. She denies shortness of breath.   Past Medical History:  Diagnosis Date  . Acid reflux disease   . Arthritis   . Bipolar 1 disorder ([place])   . Cervical cancer ([place])   . IBS (irritable bowel syndrome)   . Panic attack   . Seizures [facility])     Patient Active Problem List   Diagnosis Date Noted  . Tobacco use disorder 05/13/2016  . Dysuria 05/15/2015  . Cervical intraepithelial neoplasia grade III with severe dysplasia 01/23/2015    Past Surgical History:  Procedure Laterality Date  . biospy    . CERVICAL BIOPSY      Prior to Admission medications   Medication Sig Start Date End Date Taking? Authorizing Provider  guaiFENesin-codeine (ROBITUSSIN AC) 100-10 MG/5ML syrup Take 5 mLs by mouth 3 (three) times daily as needed for cough. 07/28/16   Victorino Dike, FNP  ibuprofen (ADVIL,MOTRIN) 600 MG tablet Take 1 tablet (600 mg total) by mouth every 8 (eight) hours as needed. Patient not taking: Reported on 05/28/2016 03/18/16   Sable Feil, PA-C  methocarbamol (ROBAXIN-750) 750 MG tablet Take 1 tablet (750 mg total) by mouth 4 (four) times daily. Patient not taking: Reported on 05/28/2016 03/18/16   Sable Feil, PA-C  phenazopyridine (PYRIDIUM) 100 MG tablet Take 1 tablet (100 mg total) by mouth 3 (three) times daily as needed for pain (May take 1-2 as needed three times  daily). Patient not taking: Reported on 05/28/2016 02/03/16   Jami L Hagler, PA-C    Allergies Dilaudid [hydromorphone hcl]; Morphine and related; and Paxil [paroxetine hcl]  Family History  Problem Relation Age of Onset  . COPD Father   . Heart disease Father   . Hypertension Maternal Grandfather   . Heart disease Maternal Grandmother   . Diverticulosis Maternal Grandmother   . Stroke Maternal Aunt     Social History Social History  Substance Use Topics  . Smoking status: Current Every Day Smoker    Packs/day: 2.00    Years: 15.00    Types: Cigarettes  . Smokeless tobacco: Former Systems developer    Quit date: 01/22/2005  . Alcohol use No    Review of Systems Constitutional: Negative for fever/chills ENT: Positive for sore throat. Cardiovascular: Denies chest pain. Respiratory: Negative shortness of breath. Positive for cough. Gastrointestinal: Negative for nausea,  no vomiting.  No diarrhea.  Musculoskeletal: Positive for body aches Skin: Negative for rash. Neurological: Positive for headaches ____________________________________________   PHYSICAL EXAM:  VITAL SIGNS: ED Triage Vitals [07/28/16 1108]  Enc Vitals Group     BP (!) 115/41     Pulse Rate (!) 102     Resp 18     Temp 98.7 F (37.1 C)     Temp Source Oral     SpO2 98 %     Weight 170 lb (77.1 kg)     Height 5\' 5"  (1.651 m)  Head Circumference      Peak Flow      Pain Score 8     Pain Loc      Pain Edu?      Excl. in [place]?     Constitutional: Alert and oriented. Well appearing and in no acute distress. Eyes: Conjunctivae are normal. EOMI. Ears: Bilateral tympanic membranes intact and within normal limits Nose: No sinus congestion; no rhinnorhea. Mouth/Throat: Mucous membranes are moist.  Oropharynx mildly erythematous. Tonsils appear without exudate. Neck: No stridor.  Lymphatic: No cervical lymphadenopathy. Cardiovascular: Normal rate, regular rhythm. Grossly normal heart sounds.  Good peripheral  circulation. Respiratory: Normal respiratory effort.  No retractions. Breath sounds clear throughout. Gastrointestinal: Soft and nontender.  Musculoskeletal: FROM x 4 extremities.  Neurologic:  Normal speech and language.  Skin:  Skin is warm, dry and intact. No rash noted. Psychiatric: Mood and affect are normal. Speech and behavior are normal.  ____________________________________________   LABS (all labs ordered are listed, but only abnormal results are displayed)  Labs Reviewed - No data to display ____________________________________________  EKG   ____________________________________________  RADIOLOGY  Not indicated ____________________________________________   PROCEDURES  Procedure(s) performed: None  Critical Care performed: No  ____________________________________________   INITIAL IMPRESSION / ASSESSMENT AND PLAN / ED COURSE  Clinical Course     Pertinent labs & imaging results that were available during my care of the patient were reviewed by me and considered in my medical decision making (see chart for details).   30 year old female presenting to the emergency department for treatment of an upper respiratory infection. She will be given a prescription for Robitussin-AC and advised follow-up with the primary care provider for symptoms that are not improving over the next few days, or return to the emergency department for symptoms that change or worsen. ____________________________________________   FINAL CLINICAL IMPRESSION(S) / ED DIAGNOSES  Final diagnoses:  Viral URI with cough    Note:  This document was prepared using Dragon voice recognition software and may include unintentional dictation errors.     Victorino Dike, FNP 07/29/16 [place], MD 07/29/16 2030

## 2016-07-28 NOTE — ED Triage Notes (Signed)
Patient presents to the ED with sore throat x 2 days.  Patient states she had difficulty sleeping last night due to her throat.

## 2016-07-28 NOTE — Discharge Instructions (Signed)
Follow up with the primary care provider of your choice for symptoms that are not improving over the week °Return to the ER for symptoms that change or worsen if unable to schedule an appointment. °

## 2016-08-29 ENCOUNTER — Emergency Department
Admission: EM | Admit: 2016-08-29 | Discharge: 2016-08-29 | Disposition: A | Discharge status: Home / Self Care | Payer: Medicaid Other | Attending: Emergency Medicine | Admitting: Emergency Medicine

## 2016-08-29 ENCOUNTER — Encounter: Payer: Self-pay | Admitting: Emergency Medicine

## 2016-08-29 DIAGNOSIS — F17210 Nicotine dependence, cigarettes, uncomplicated: Secondary | ICD-10-CM | POA: Diagnosis not present

## 2016-08-29 DIAGNOSIS — N760 Acute vaginitis: Secondary | ICD-10-CM | POA: Diagnosis not present

## 2016-08-29 DIAGNOSIS — B9689 Other specified bacterial agents as the cause of diseases classified elsewhere: Secondary | ICD-10-CM

## 2016-08-29 DIAGNOSIS — R300 Dysuria: Secondary | ICD-10-CM | POA: Diagnosis present

## 2016-08-29 LAB — URINALYSIS, ROUTINE W REFLEX MICROSCOPIC
BILIRUBIN URINE: NEGATIVE
GLUCOSE, UA: NEGATIVE mg/dL
KETONES UR: NEGATIVE mg/dL
LEUKOCYTES UA: NEGATIVE
Nitrite: NEGATIVE
PH: 5 (ref 5.0–8.0)
Protein, ur: NEGATIVE mg/dL
Specific Gravity, Urine: 1.026 (ref 1.005–1.030)

## 2016-08-29 LAB — POCT PREGNANCY, URINE: Preg Test, Ur: NEGATIVE

## 2016-08-29 LAB — WET PREP, GENITAL
SPERM: NONE SEEN
Trich, Wet Prep: NONE SEEN
Yeast Wet Prep HPF POC: NONE SEEN

## 2016-08-29 MED ORDER — METRONIDAZOLE 500 MG PO TABS: 500.0000 mg | ORAL_TABLET | Freq: Two times a day (BID) | ORAL | 0 refills | Status: DC

## 2016-08-29 NOTE — ED Notes (Signed)
States she has lower back pain with dysuria since yesterday  Hx of UTI's in past and feels same

## 2016-08-29 NOTE — ED Triage Notes (Signed)
Pt with dysuria started two days ago but worse today. Hx of UTI.

## 2016-08-29 NOTE — ED Provider Notes (Signed)
[facility] Emergency Department Provider Note  ____________________________________________   First MD Initiated Contact with Patient 08/29/16 1335     (approximate)  I have reviewed the triage vital signs and the nursing notes.   HISTORY  Chief Complaint Dysuria    HPI Terry Walters is a 31 y.o. female is here complaint of dysuria for the last 2 days but symptoms became worse today. Patient states she has a history of urinary tract infections. She denies any fever or chills, no nausea or vomiting. She denies any vaginal discharge. She states that this is her normal burning with urination that she feels. Patient has not taken any over-the-counter medication for dysuria. Currently she rates her pain as a 9/10.   Past Medical History:  Diagnosis Date  . Acid reflux disease   . Arthritis   . Bipolar 1 disorder ([place])   . Cervical cancer ([place])   . IBS (irritable bowel syndrome)   . Panic attack   . Seizures [facility])     Patient Active Problem List   Diagnosis Date Noted  . Tobacco use disorder 05/13/2016  . Dysuria 05/15/2015  . Cervical intraepithelial neoplasia grade III with severe dysplasia 01/23/2015    Past Surgical History:  Procedure Laterality Date  . biospy    . CERVICAL BIOPSY      Prior to Admission medications   Medication Sig Start Date End Date Taking? Authorizing Provider  metroNIDAZOLE (FLAGYL) 500 MG tablet Take 1 tablet (500 mg total) by mouth 2 (two) times daily. 08/29/16   Johnn Hai, PA-C    Allergies Dilaudid [hydromorphone hcl]; Morphine and related; and Paxil [paroxetine hcl]  Family History  Problem Relation Age of Onset  . COPD Father   . Heart disease Father   . Hypertension Maternal Grandfather   . Heart disease Maternal Grandmother   . Diverticulosis Maternal Grandmother   . Stroke Maternal Aunt     Social History Social History  Substance Use Topics  . Smoking status: Current Every Day Smoker   Packs/day: 2.00    Years: 15.00    Types: Cigarettes  . Smokeless tobacco: Former Systems developer    Quit date: 01/22/2005  . Alcohol use No    Review of Systems Constitutional: No fever/chills Cardiovascular: Denies chest pain. Respiratory: Denies shortness of breath. Gastrointestinal: No abdominal pain.  No nausea, no vomiting.  Genitourinary: Positive for dysuria. Negative for vaginal discharge. Musculoskeletal: Negative for back pain. Skin: Negative for rash. Neurological: Negative for headaches, focal weakness or numbness.  10-point ROS otherwise negative.  ____________________________________________   PHYSICAL EXAM:  VITAL SIGNS: ED Triage Vitals  Enc Vitals Group     BP 08/29/16 1241 118/70     Pulse Rate 08/29/16 1241 89     Resp 08/29/16 1241 20     Temp 08/29/16 1241 98.8 F (37.1 C)     Temp Source 08/29/16 1241 Oral     SpO2 08/29/16 1241 98 %     Weight 08/29/16 1232 170 lb (77.1 kg)     Height --      Head Circumference --      Peak Flow --      Pain Score 08/29/16 1232 9     Pain Loc --      Pain Edu? --      Excl. in [place]? --     Constitutional: Alert and oriented. Well appearing and in no acute distress. Eyes: Conjunctivae are normal. PERRL. EOMI. Head: Atraumatic. Nose:  No congestion/rhinnorhea. Neck: No stridor.   Cardiovascular: Normal rate, regular rhythm. Grossly normal heart sounds.  Good peripheral circulation. Respiratory: Normal respiratory effort.  No retractions. Lungs CTAB. Gastrointestinal: Soft and nontender. No distention. No CVA tenderness. Genitourinary: Normal external female genitalia. There is white exudate present in the vaginal vault. There is minimal tenderness on palpation cervical motion. There is no adnexal masses or tenderness. Musculoskeletal: No lower extremity tenderness nor edema.  No joint effusions. Neurologic:  Normal speech and language. No gross focal neurologic deficits are appreciated. No gait instability. Skin:  Skin is  warm, dry and intact. No rash noted. Psychiatric: Mood and affect are normal. Speech and behavior are normal.  ____________________________________________   LABS (all labs ordered are listed, but only abnormal results are displayed)  Labs Reviewed  WET PREP, GENITAL - Abnormal; Notable for the following:       Result Value   Clue Cells Wet Prep HPF POC PRESENT (*)    WBC, Wet Prep HPF POC FEW (*)    All other components within normal limits  URINALYSIS, ROUTINE W REFLEX MICROSCOPIC - Abnormal; Notable for the following:    Color, Urine YELLOW (*)    APPearance HAZY (*)    Hgb urine dipstick MODERATE (*)    Bacteria, UA RARE (*)    Squamous Epithelial / LPF 6-30 (*)    All other components within normal limits  POC URINE PREG, ED  POCT PREGNANCY, URINE    PROCEDURES  Procedure(s) performed: None  Procedures  Critical Care performed: No  ____________________________________________   INITIAL IMPRESSION / ASSESSMENT AND PLAN / ED COURSE  Pertinent labs & imaging results that were available during my care of the patient were reviewed by me and considered in my medical decision making (see chart for details).    Clinical Course    Patient is here with a complaint of dysuria. Urinalysis did not explain her symptoms. Wet prep was positive for clue cells. Patient was informed that she has bacterial vaginosis which is very common and also gives same symptoms as a urinary tract infection. Patient was given a prescription for Flagyl 500 mg twice a day for 7 days. She is to follow-up with her primary care or [facility] if any continued problems.  ____________________________________________   FINAL CLINICAL IMPRESSION(S) / ED DIAGNOSES  Final diagnoses:  Bacterial vaginosis      NEW MEDICATIONS STARTED DURING THIS VISIT:  New Prescriptions   METRONIDAZOLE (FLAGYL) 500 MG TABLET    Take 1 tablet (500 mg total) by mouth 2 (two) times daily.      Note:  This document was prepared using Dragon voice recognition software and may include unintentional dictation errors.    Johnn Hai, PA-C 08/29/16 [place], MD 08/31/16 [phone]

## 2016-08-29 NOTE — Discharge Instructions (Signed)
Begin taking antibiotics today twice a day until completely finished. Follow-up with your primary care provider or [facility] [place] if any continued problems.

## 2016-10-21 ENCOUNTER — Other Ambulatory Visit: Payer: Self-pay | Admitting: Obstetrics and Gynecology

## 2016-10-21 ENCOUNTER — Inpatient Hospital Stay: Payer: Medicaid Other

## 2016-10-21 ENCOUNTER — Ambulatory Visit
Admission: RE | Admit: 2016-10-21 | Discharge: 2016-10-21 | Disposition: A | Discharge status: Home / Self Care | Payer: Medicaid Other | Source: Ambulatory Visit | Attending: Obstetrics and Gynecology | Admitting: Obstetrics and Gynecology

## 2016-10-21 ENCOUNTER — Inpatient Hospital Stay: Payer: Medicaid Other | Attending: Obstetrics and Gynecology | Admitting: Obstetrics and Gynecology

## 2016-10-21 VITALS — BP 109/73 | HR 80 | Temp 98.4°F | Resp 18 | Ht 65.0 in | Wt 174.8 lb

## 2016-10-21 DIAGNOSIS — R300 Dysuria: Secondary | ICD-10-CM

## 2016-10-21 DIAGNOSIS — K219 Gastro-esophageal reflux disease without esophagitis: Secondary | ICD-10-CM | POA: Diagnosis not present

## 2016-10-21 DIAGNOSIS — F17210 Nicotine dependence, cigarettes, uncomplicated: Secondary | ICD-10-CM | POA: Insufficient documentation

## 2016-10-21 DIAGNOSIS — D069 Carcinoma in situ of cervix, unspecified: Secondary | ICD-10-CM

## 2016-10-21 DIAGNOSIS — R102 Pelvic and perineal pain: Secondary | ICD-10-CM | POA: Insufficient documentation

## 2016-10-21 DIAGNOSIS — F319 Bipolar disorder, unspecified: Secondary | ICD-10-CM | POA: Diagnosis not present

## 2016-10-21 DIAGNOSIS — M1990 Unspecified osteoarthritis, unspecified site: Secondary | ICD-10-CM | POA: Insufficient documentation

## 2016-10-21 DIAGNOSIS — R569 Unspecified convulsions: Secondary | ICD-10-CM | POA: Insufficient documentation

## 2016-10-21 DIAGNOSIS — N920 Excessive and frequent menstruation with regular cycle: Secondary | ICD-10-CM | POA: Insufficient documentation

## 2016-10-21 DIAGNOSIS — K589 Irritable bowel syndrome without diarrhea: Secondary | ICD-10-CM | POA: Diagnosis not present

## 2016-10-21 DIAGNOSIS — R5383 Other fatigue: Secondary | ICD-10-CM | POA: Diagnosis not present

## 2016-10-21 DIAGNOSIS — R197 Diarrhea, unspecified: Secondary | ICD-10-CM | POA: Insufficient documentation

## 2016-10-21 DIAGNOSIS — A09 Infectious gastroenteritis and colitis, unspecified: Secondary | ICD-10-CM | POA: Insufficient documentation

## 2016-10-21 LAB — URINALYSIS, ROUTINE W REFLEX MICROSCOPIC
Bacteria, UA: NONE SEEN
Bilirubin Urine: NEGATIVE
Glucose, UA: NEGATIVE mg/dL
Ketones, ur: NEGATIVE mg/dL
Leukocytes, UA: NEGATIVE
Nitrite: NEGATIVE
Protein, ur: NEGATIVE mg/dL
Specific Gravity, Urine: 1.025 (ref 1.005–1.030)
pH: 6 (ref 5.0–8.0)

## 2016-10-21 NOTE — Progress Notes (Signed)
Gynecologic Oncology Interval Note  Referring Provider: Dr Janice Norrie  Chief Concern: CIN III with microinvasion, pelvic pain  Subjective:  Terry Walters is a 31 y.o. woman who presents today for continued surveillance for history of cervical dysplasia.  Had cone bx with positive ectocervical margin 4/16.  Chose conservative management with PAP follow up over hysterectomy.  She presents for evaluation of pelvic pain today. She has had some pelvic pain, bilateral for the last few days radiating to groins with dysuria and comes for earlier follow up visit.  No fever or flank pain, but some fatigue and weakness.  Also has 4 loose BMs per day.  Has menses twice a month last 3/21. Her cycles are irregular with normal flow one month and then 2 cycles the next month.   History:   This G1 P1 patient was seen by Dr Janice Norrie for evaluation of a PAP 04/25/14 showing ASCUS and HPV HR+. She also had heavy menstrual periods, some bleeding in between and inability to conceive. Her husband has a low sperm count due to chemotherapy for recurrent Hodgkins disease.   Dr. Laymond Purser in [place] performed a colposcopy in 10/15.  A large mosaic lesion was noted on the anterior cervix and biopsy showed high-grade squamous intraepithelial lesion.  A LEEP was planned and the patient also requested a tubal ligation.  After discussion of various contraceptive methods the patient elected to have a Mirena IUD inserted as it would help with her menorrhagia and would be reversible.   On September 25, 2014 she underwent LEEP, hysteroscopy and Mirena IUD insertion. The LEEP was done in three pieces (anterior, posterior, endocervix). 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  Margins were positive and there was endocervical gland involvement.  This pathology was reviewed and confirmed at the [place] [facility] by Dr  Alisia Ferrari.  Endometrium was proliferative.  She underwent cone biopsy at [facility] with Dr Fransisca Connors on 11/21/14. Mirena IUD was removed at that time.     A. ECTOCERVIX; CONIZATION:  - HIGH-GRADE SQUAMOUS INTRAEPITHELIAL LESION (CIN 2-3).  - AN ECTOCERVICAL MARGIN IS INVOLVED IN THE 9 TO 12:00 QUADRANT.  - THE ENDOCERVICAL MARGINS OF EXCISION ARE NEGATIVE   B. ENDOCERVIX; CONIZATION:  - BENIGN FIBROUS TISSUE.   9/16 PAP normal and HR HPV was negative.  11/13/2015 Pap negative; HRHPV negative.  She has a history of a learning disorder, seizures and significant bipolar mood disorder and is taking Celexa. She has a history of depression with hospitalization and suicide attempt.  Her husband having Non-Hodgkin's Lymphoma and is sterile.  Problem List: Patient Active Problem List   Diagnosis Date Noted  . Tobacco use disorder 05/13/2016  . Dysuria 05/15/2015  . Cervical intraepithelial neoplasia grade III with severe dysplasia 01/23/2015    Past Medical History: Past Medical History:  Diagnosis Date  . Acid reflux disease   . Arthritis   . Bipolar 1 disorder ([place])   . Cervical cancer ([place])   . IBS (irritable bowel syndrome)   . Panic attack   . Seizures ([place])     Past Surgical History: Past Surgical History:  Procedure Laterality Date  . biospy    . CERVICAL BIOPSY      Family History: Family History  Problem Relation Age of Onset  . COPD Father   . Heart disease Father   . Hypertension Maternal Grandfather   .  Heart disease Maternal Grandmother   . Diverticulosis Maternal Grandmother   . Stroke Maternal Aunt     Social History: Social History   Social History  . Marital status: Married    Spouse name: N/A  . Number of children: N/A  . Years of education: N/A   Occupational History  . Not on file.   Social History Main Topics  . Smoking status: Current Every Day Smoker    Packs/day: 1.00    Years: 15.00    Types: Cigarettes  . Smokeless tobacco: Former  Systems developer    Quit date: 01/22/2005  . Alcohol use Yes     Comment: occasional beer  . Drug use: No  . Sexual activity: Yes   Other Topics Concern  . Not on file   Social History Narrative  . No narrative on file    Allergies: Allergies  Allergen Reactions  . Dilaudid [Hydromorphone Hcl]   . Morphine And Related   . Paxil [Paroxetine Hcl]     Current Medications: Current Outpatient Prescriptions  Medication Sig Dispense Refill  . acetaminophen (TYLENOL) 325 MG suppository Place 325 mg rectally every 8 (eight) hours as needed.    Marland Kitchen ibuprofen (ADVIL,MOTRIN) 200 MG tablet Take 200 mg by mouth every 6 (six) hours as needed.     No current facility-administered medications for this visit.     Review of Systems General: no complaints  HEENT: no complaints  Lungs: cough  Cardiac: no complaints  GI: no complaints  GU: no complaints  Musculoskeletal: no complaints  Extremities: no complaints  Skin: no complaints  Neuro: no complaints  Endocrine: no complaints  Psych: no complaints       Objective:  Physical Examination:  BP 109/73   Pulse 80   Temp 98.4 F (36.9 C) (Tympanic)   Resp 18   Ht 5\' 5"  (1.651 m)   Wt 174 lb 13.2 oz (79.3 kg)   BMI 29.09 kg/m   General appearance: alert, cooperative and appears stated age HEENT:PERRLA, sclera clear, anicteric, neck supple with midline trachea and thyroid without masses Lymph node survey: inguinal nodes negative Abdomen: soft, no masses or hernias. Non tender. Extremities: no edema or lesions  Neurological exam reveals: alert, oriented, normal speech, no focal findings or movement disorder noted.  Pelvic: exam chaperoned by nurse;  Vulva: normal appearing vulva with no masses, tenderness or lesions; Vagina: normal; Cervix: flush with upper vault and feels normal. No palpable lesions; Adnexa: mild lower abdominal tenderness, ; Uterus: uterus is normal size, shape, consistency and nontender;  Rectal: deferred.      Assessment:  Terry Walters is a 31 y.o. female with a history of CIN III with microinvasion on LEEP in 2/16.  Cone biopsy 4/16 with CIN III with positive ectocervical margin from 9-12 o'clock. She does not want to proceed with hysterectomy unless it is absolutely needed.  Although she had a positive ectocervical margin, it is possible that all the CIN III was all removed. Normal PAP and HR HPV in 9/16 and 3/17.   Tobacco usage.   Plan:   Problem List Items Addressed This Visit    None    Visit Diagnoses    Carcinoma in situ of cervix, unspecified location    -  Primary     Will order urinalysis and culture to rule out UTI and stool sample for C diff.  Also will check pelvic US.  Am concerned that her external cervical os is stenotic and that she  may have hematometria despite some monthly bleeding.  If all these studies are negative will consider ordering CT scan to look for other sources of pain.  According to ASCCP guidelines she needs Pap/HPV cotesting at 12 and 24 months and this was done today. She will RTC in 6 months due to positive ectocervical margin and microinvasion on LEEP.    She is comfortable with the plan and had her questions answered.   Mellody Drown, MD

## 2016-10-21 NOTE — Progress Notes (Signed)
  Oncology Nurse Navigator Documentation Chaperoned pelvic exam. Pap/hpv sent to lab. Ms Grimley sent to lab for Urine culture and obtaining cdiff instructions. Navigator Location: [facility] (10/21/16 1100)   )Navigator Encounter Type: Follow-up Appt (10/21/16 1100)                                                    Time Spent with Patient: 15 (10/21/16 1100)

## 2016-10-21 NOTE — Progress Notes (Signed)
Pt here for pain in pelvic area, radiating to groin for couple of weeks. Worse in last 2 days. Pt has frequesnt UTI per husband- last one about6-8 weeks ago.

## 2016-10-22 ENCOUNTER — Telehealth: Payer: Self-pay

## 2016-10-22 LAB — URINE CULTURE

## 2016-10-22 NOTE — Telephone Encounter (Signed)
  Oncology Nurse Navigator Documentation Called and notified Ms Lobban's spouse that her U/S showed the following: IMPRESSION: No acute process in the pelvis.  Sonographic findings of pelvic congestion syndrome as seen on prior CT.  Educated further on pelvic congestion syndrome. Her urine also showed the following: Urinalysis with microscopic  Order: [id number]  Status:  Final result  Visible to patient:  No (Not Released)  Next appt:  04/21/2017 at 10:30 AM in Oncology ([address])  Dx:  Carcinoma in situ of cervix, unspecif...    Ref Range & Units 1d ago   Color, Urine YELLOW YELLOW    APPearance CLEAR CLEAR    Specific Gravity, Urine 1.005 - 1.030 1.025   pH 5.0 - 8.0 6.0   Glucose, UA NEGATIVE mg/dL NEGATIVE   Hgb urine dipstick NEGATIVE MODERATE    Bilirubin Urine NEGATIVE NEGATIVE   Ketones, ur NEGATIVE mg/dL NEGATIVE   Protein, ur NEGATIVE mg/dL NEGATIVE   Nitrite NEGATIVE NEGATIVE   Leukocytes, UA NEGATIVE NEGATIVE   RBC / HPF 0 - 5 RBC/hpf 0-5   WBC, UA 0 - 5 WBC/hpf 0-5   Bacteria, UA NONE SEEN NONE SEEN   Squamous Epithelial / LPF NONE SEEN 0-5    Mucous  PRESENT   Resulting Agency  [facility]    Specimen Collected: 10/21/16 11:30 Last Resulted: 10/21/16 12:21            He states that she does not see a Dealer. Educated that Urology may be able to explain her frequent UTI's and blood in her urine. Per Dr. Fransisca Connors if she continues to have pain in 2 weeks we can perform a CT scan for her. Instructed to call if pain persists.   Navigator Location: [facility] (10/22/16 0900)   )Navigator Encounter Type: Telephone;Diagnostic Results (10/22/16 0900) Telephone: Outgoing Call;Diagnostic Results (10/22/16 0900)                                                  Time Spent with Patient: 15 (10/22/16 0900)

## 2016-10-26 LAB — PAP LB AND HPV HIGH-RISK
HPV, high-risk: NEGATIVE
PAP SMEAR COMMENT: 0

## 2016-11-02 ENCOUNTER — Telehealth: Payer: Self-pay | Admitting: *Deleted

## 2016-11-02 NOTE — Telephone Encounter (Signed)
Received call from spouse to notify MD that patient's abdominal pain is persisting and they would like to pursue the plan that was discussed including CT scan. Noted in prior note, "Per Dr. Fransisca Connors if she continues to have pain in 2 weeks we can perform a CT scan for her. Instructed to call if pain persists.". Informed spouse that we would arrange for the scan and they should expect a call from scheduling with the appt details.

## 2016-11-03 ENCOUNTER — Other Ambulatory Visit: Payer: Self-pay

## 2016-11-03 DIAGNOSIS — D069 Carcinoma in situ of cervix, unspecified: Secondary | ICD-10-CM

## 2016-11-03 DIAGNOSIS — R109 Unspecified abdominal pain: Secondary | ICD-10-CM | POA: Insufficient documentation

## 2016-11-03 NOTE — Telephone Encounter (Signed)
Orders have been placed for Ct abdomen/pelvis. Scheduling notified.

## 2016-11-05 ENCOUNTER — Telehealth: Payer: Self-pay

## 2016-11-05 NOTE — Telephone Encounter (Signed)
Spoke w/ Pt husband. Appt details given. Instructions to pick up prep kit also.

## 2016-11-11 ENCOUNTER — Ambulatory Visit: Payer: Medicaid Other

## 2016-11-11 ENCOUNTER — Ambulatory Visit: Admission: RE | Admit: 2016-11-11 | Payer: Medicaid Other | Source: Ambulatory Visit

## 2016-11-17 ENCOUNTER — Ambulatory Visit
Admission: RE | Admit: 2016-11-17 | Discharge: 2016-11-17 | Disposition: A | Discharge status: Home / Self Care | Payer: Medicaid Other | Source: Ambulatory Visit | Attending: Obstetrics and Gynecology | Admitting: Obstetrics and Gynecology

## 2016-11-17 DIAGNOSIS — D069 Carcinoma in situ of cervix, unspecified: Secondary | ICD-10-CM | POA: Insufficient documentation

## 2016-11-17 DIAGNOSIS — R109 Unspecified abdominal pain: Secondary | ICD-10-CM

## 2016-11-17 DIAGNOSIS — R911 Solitary pulmonary nodule: Secondary | ICD-10-CM | POA: Insufficient documentation

## 2016-11-17 MED ORDER — IOPAMIDOL (ISOVUE-300) INJECTION 61%
100.0000 mL | Freq: Once | INTRAVENOUS | Status: AC | PRN
  Administered 2016-11-17: 100 mL via INTRAVENOUS

## 2016-11-18 ENCOUNTER — Telehealth: Payer: Self-pay

## 2016-11-18 NOTE — Telephone Encounter (Signed)
  Oncology Nurse Navigator Documentation Voicemail left with Ms. Lemon to return call to go over CT results. Navigator Location: [facility] (11/18/16 1000)   )Navigator Encounter Type: Telephone;Diagnostic Results (11/18/16 1000) Telephone: Diagnostic Results (11/18/16 1000)                                                  Time Spent with Patient: 15 (11/18/16 1000)

## 2016-11-20 ENCOUNTER — Telehealth: Payer: Self-pay

## 2016-11-20 NOTE — Telephone Encounter (Signed)
  Oncology Nurse Navigator Documentation Voicemail left on mobile number to return call to go over CT results. Unable to leave message on home number. Message sent to Shawn(Lung CT screening) for nodule following. Navigator Location: [facility] (11/20/16 0900)   )Navigator Encounter Type: Telephone;Diagnostic Results (11/20/16 0900) Telephone: Diagnostic Results (11/20/16 0900)                                                  Time Spent with Patient: 15 (11/20/16 0900)

## 2016-11-23 ENCOUNTER — Telehealth: Payer: Self-pay | Admitting: *Deleted

## 2016-11-23 NOTE — Telephone Encounter (Signed)
Notified of CT results. Reports abdominal pain "comes and goes" Diarrhea has stopped and that is why she did not bring specimen for Cdiff testing. Instructed that she needs to have a repeat CT scan for the lung nodule in 6 months. Verbalized understanding.

## 2016-11-23 NOTE — Telephone Encounter (Signed)
Asking for results form CT done 4/3. Please call her back [phone]  IMPRESSION: No acute findings within the abdomen or pelvis.  6 mm pulmonary nodule in inferior right middle lobe. Non-contrast chest CT at 6-12 months is recommended. If the nodule is stable at time of repeat CT, then future CT at 18-24 months (from today's scan) is considered optional for low-risk patients, but is recommended for high-risk patients. This recommendation follows the consensus statement: Guidelines for Management of Incidental Pulmonary Nodules Detected on CT Images: From the Fleischner Society 2017; Radiology 2017; 284:228-243.   Electronically Signed   By: Earle Gell M.D.   On: 11/17/2016 16:39

## 2017-02-12 IMAGING — US US PELVIS COMPLETE
1 series · 14 of 25 positions shown · non-contrast
Comparison: None

CLINICAL DATA: Cervical motion tenderness. Pelvic pain for 2 weeks.



[Series 1: us pelvis complete · 0.25mm/px · 14 of 72 slices shown]
[im 1/72]
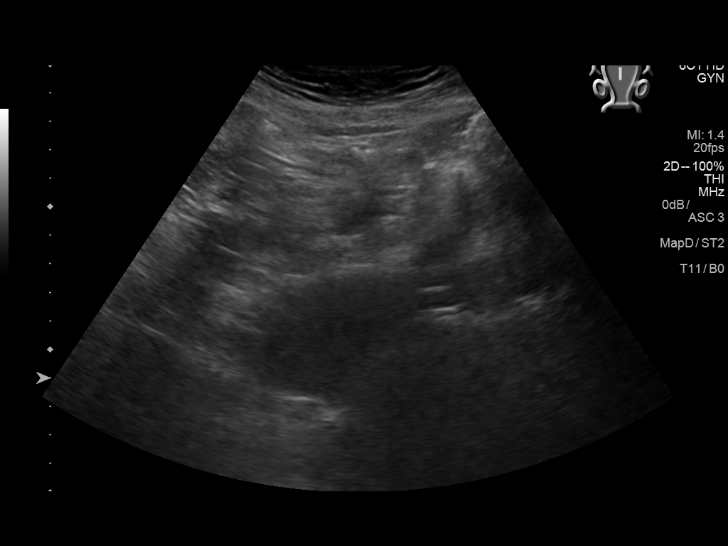
[im 6/72]
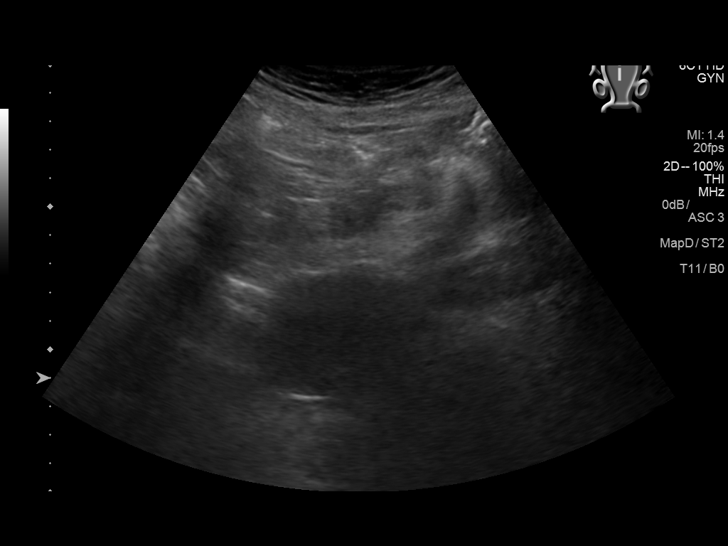
[im 12/72]
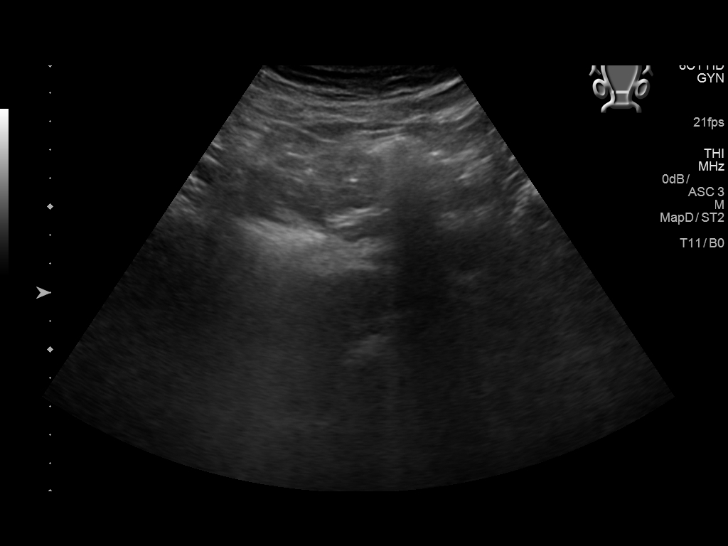
[im 18/72]
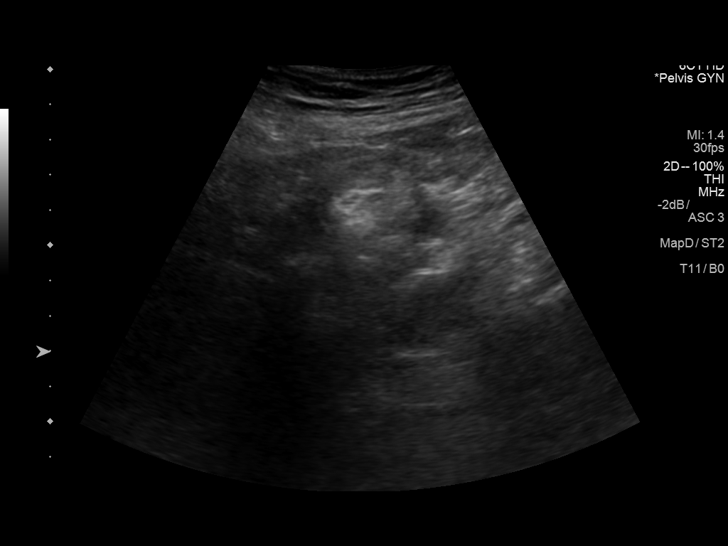
[im 24/72]
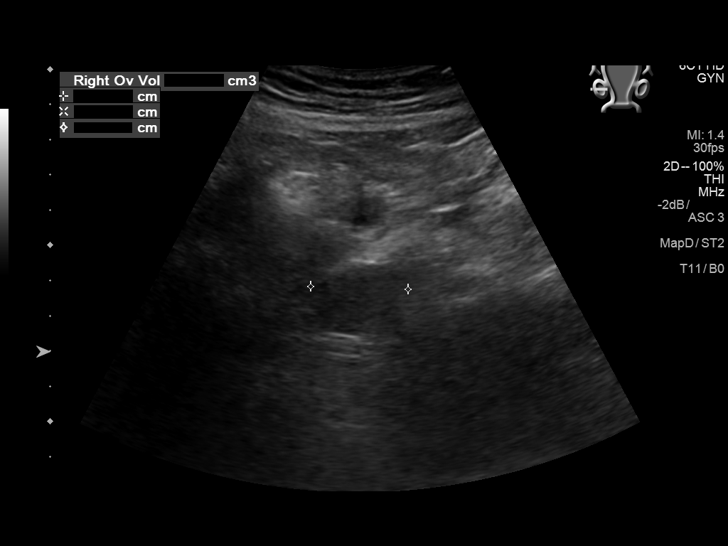
[im 27/72]
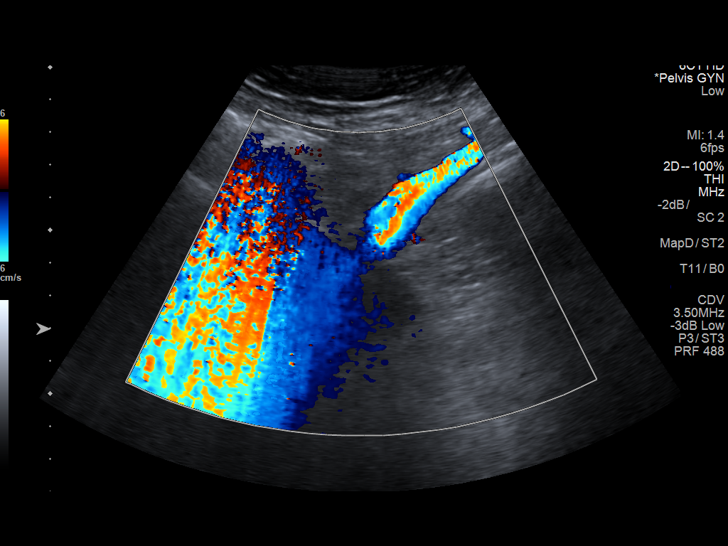
[im 33/72]
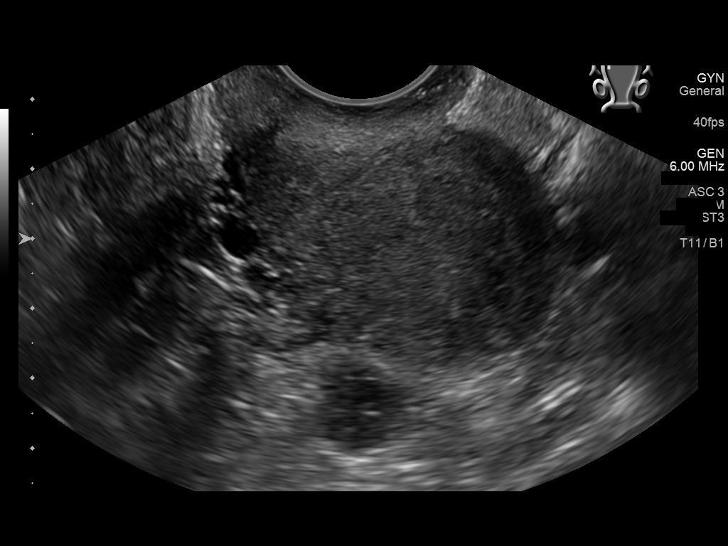
[im 39/72]
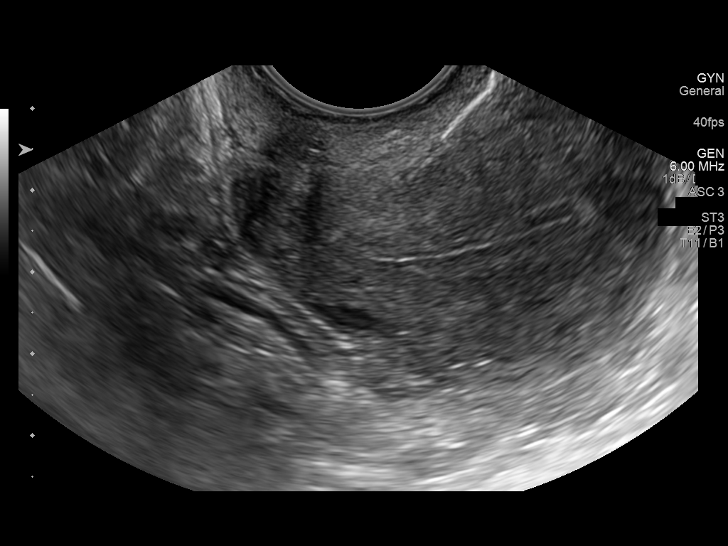
[im 45/72]
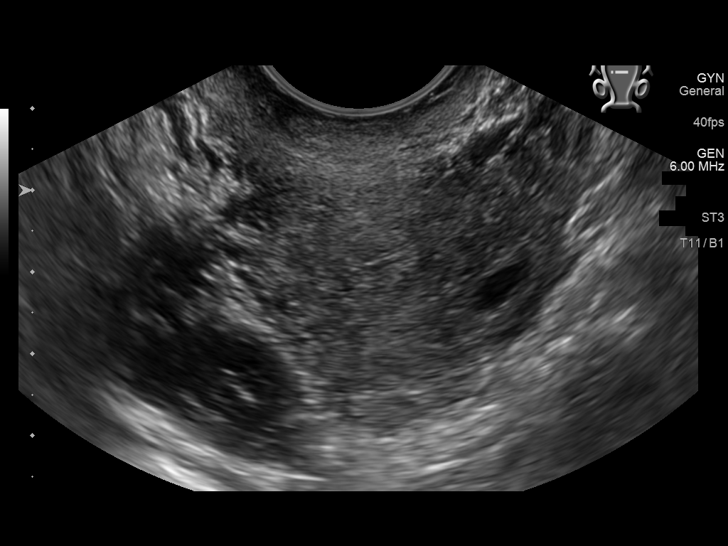
[im 48/72]
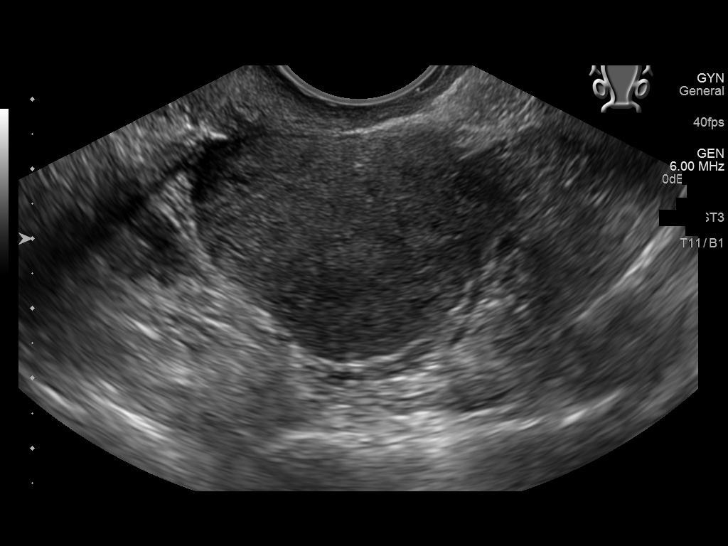
[im 54/72]
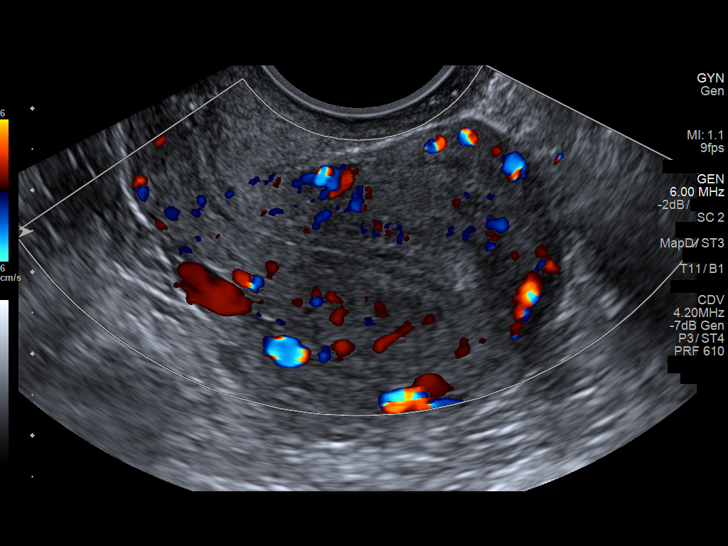
[im 60/72]
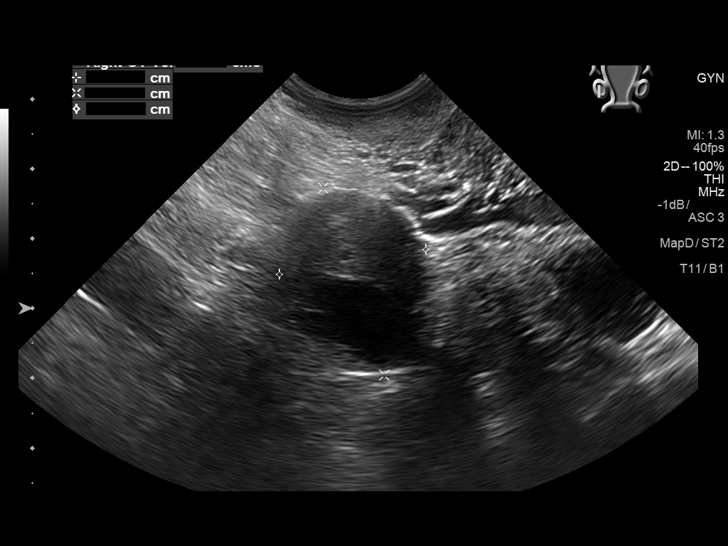
[im 66/72]
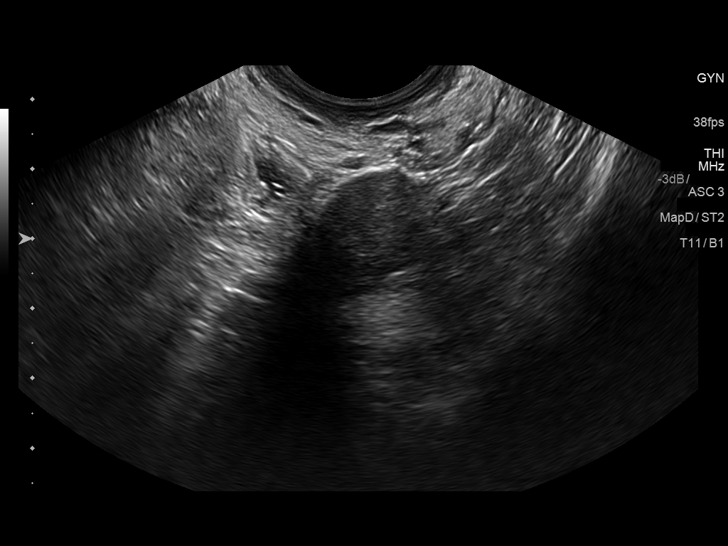
[im 72/72]
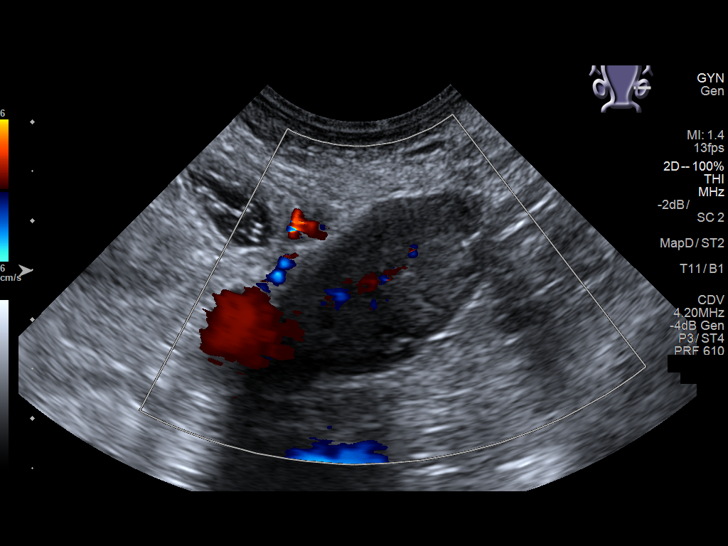

[14 of 25 positions shown; findings below may reference images not displayed]

FINDINGS: Uterus

Measurements: 7.3 x 4.3 x 5.0 cm. No fibroids or other mass
visualized.

Endometrium

Thickness: 6 mm.  No focal abnormality visualized.

Right ovary

Measurements: 2.9 x 2.8 x 2.1 cm. Normal appearance/no adnexal mass.

Left ovary

Measurements: 2.1 x 2.3 x 1.5 cm. Normal appearance/no adnexal mass.

Other findings

No abnormal free fluid.
IMPRESSION: No cause for the patient's symptoms identified.

## 2017-04-21 ENCOUNTER — Inpatient Hospital Stay: Payer: Medicaid Other

## 2017-05-19 ENCOUNTER — Ambulatory Visit: Payer: Medicaid Other

## 2017-06-01 ENCOUNTER — Telehealth: Payer: Self-pay

## 2017-06-01 NOTE — Telephone Encounter (Signed)
  Oncology Nurse Navigator Documentation Attempt made to call Terry Walters with appointment reminder. No answer and no voicemail set up on home phone. Mobile number is incorrect per answered call. Navigator Location: [facility] (06/01/17 0900)   )Navigator Encounter Type: Telephone;Follow-up Appt (06/01/17 0900) Telephone: Outgoing Call;Appt Confirmation/Clarification (06/01/17 0900)                                                  Time Spent with Patient: 15 (06/01/17 0900)

## 2017-06-02 ENCOUNTER — Inpatient Hospital Stay: Payer: Medicaid Other

## 2017-06-23 ENCOUNTER — Inpatient Hospital Stay: Payer: Medicaid Other

## 2017-06-23 ENCOUNTER — Other Ambulatory Visit: Payer: Self-pay | Admitting: Obstetrics and Gynecology

## 2017-06-23 ENCOUNTER — Inpatient Hospital Stay: Payer: Medicaid Other | Attending: Obstetrics and Gynecology | Admitting: Obstetrics and Gynecology

## 2017-06-23 VITALS — BP 106/72 | HR 72 | Temp 99.3°F | Resp 18 | Ht 65.0 in | Wt 173.1 lb

## 2017-06-23 DIAGNOSIS — R3121 Asymptomatic microscopic hematuria: Secondary | ICD-10-CM | POA: Insufficient documentation

## 2017-06-23 DIAGNOSIS — R300 Dysuria: Secondary | ICD-10-CM | POA: Diagnosis not present

## 2017-06-23 DIAGNOSIS — K580 Irritable bowel syndrome with diarrhea: Secondary | ICD-10-CM | POA: Diagnosis not present

## 2017-06-23 DIAGNOSIS — F319 Bipolar disorder, unspecified: Secondary | ICD-10-CM | POA: Diagnosis not present

## 2017-06-23 DIAGNOSIS — R911 Solitary pulmonary nodule: Secondary | ICD-10-CM | POA: Insufficient documentation

## 2017-06-23 DIAGNOSIS — R569 Unspecified convulsions: Secondary | ICD-10-CM | POA: Diagnosis not present

## 2017-06-23 DIAGNOSIS — D069 Carcinoma in situ of cervix, unspecified: Secondary | ICD-10-CM | POA: Insufficient documentation

## 2017-06-23 DIAGNOSIS — F17210 Nicotine dependence, cigarettes, uncomplicated: Secondary | ICD-10-CM | POA: Insufficient documentation

## 2017-06-23 DIAGNOSIS — K219 Gastro-esophageal reflux disease without esophagitis: Secondary | ICD-10-CM | POA: Diagnosis not present

## 2017-06-23 LAB — URINALYSIS, COMPLETE (UACMP) WITH MICROSCOPIC
BACTERIA UA: NONE SEEN
BILIRUBIN URINE: NEGATIVE
Glucose, UA: NEGATIVE mg/dL
Ketones, ur: NEGATIVE mg/dL
LEUKOCYTES UA: NEGATIVE
Nitrite: NEGATIVE
PH: 5 (ref 5.0–8.0)
PROTEIN: NEGATIVE mg/dL
Specific Gravity, Urine: 1.028 (ref 1.005–1.030)

## 2017-06-23 NOTE — Addendum Note (Signed)
Addended by: Verlon Au on: 06/23/2017 04:23 PM   Modules accepted: Orders

## 2017-06-23 NOTE — Progress Notes (Signed)
Pt states that urinating causing her pain, she has discharge and it has foul odor.

## 2017-06-23 NOTE — Progress Notes (Signed)
Gynecologic Oncology Interval Note  Referring Provider: Dr Janice Norrie  Chief Concern: CIN III with microinvasion, pelvic pain  Subjective:  Terry Walters is a 31 y.o. woman who presents today for continued surveillance for history of cervical dysplasia.  Had cone bx with positive ectocervical margin 4/16.  Chose conservative management with PAP follow up over hysterectomy.    She presented 3/18 for evaluation of pelvic pain. She has had some pelvic pain, bilateral radiating to groins with dysuria and comes for earlier follow up visit.  No fever or flank pain, but some fatigue and weakness.  Her cycles are irregular with normal flow one month and then 2 cycles the next month.  Pelvic US 3/18 and CT scan basically normal.   Still complains of above symptoms.  Normal PAP/HPV 3/18. Smoking 1-2 PPD.  3/18 CT scan abd/pelvis IMPRESSION: No acute findings within the abdomen or pelvis.  6 mm pulmonary nodule in inferior right middle lobe. Non-contrast chest CT at 6-12 months is recommended. If the nodule is stable at time of repeat CT, then future CT at 18-24 months (from today's scan) is considered optional for low-risk patients, but is recommended for high-risk patients. This recommendation follows the consensus statement: Guidelines for Management of Incidental Pulmonary Nodules Detected on CT Images: From the Fleischner Society 2017; Radiology 2017; 284:228-243.  History:   This G1 P1 patient was seen by Dr Janice Norrie for evaluation of a PAP 04/25/14 showing ASCUS and HPV HR+. She also had heavy menstrual periods, some bleeding in between and inability to conceive. Her husband has a low sperm count due to chemotherapy for recurrent Hodgkins disease.   Dr. Laymond Purser in [place] performed a colposcopy in 10/15.  A large mosaic lesion was noted on the anterior cervix and biopsy showed high-grade squamous intraepithelial lesion.  A LEEP was planned and the patient also requested a tubal ligation.   After discussion of various contraceptive methods the patient elected to have a Mirena IUD inserted as it would help with her menorrhagia and would be reversible.   On September 25, 2014 she underwent LEEP, hysteroscopy and Mirena IUD insertion. The LEEP was done in three pieces (anterior, posterior, endocervix). 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  Margins were positive and there was endocervical gland involvement.  This pathology was reviewed and confirmed at [facility] by Dr Alisia Ferrari.  Endometrium was proliferative.  She underwent cone biopsy at [facility] with Dr Fransisca Connors on 11/21/14. Mirena IUD was removed at that time.     A. ECTOCERVIX; CONIZATION:  - HIGH-GRADE SQUAMOUS INTRAEPITHELIAL LESION (CIN 2-3).  - AN ECTOCERVICAL MARGIN IS INVOLVED IN THE 9 TO 12:00 QUADRANT.  - THE ENDOCERVICAL MARGINS OF EXCISION ARE NEGATIVE   B. ENDOCERVIX; CONIZATION:  - BENIGN FIBROUS TISSUE.   9/16 PAP normal and HR HPV was negative.  11/13/2015 Pap negative; HRHPV negative.  She has a history of a learning disorder, seizures and significant bipolar mood disorder and is taking Celexa. She has a history of depression with hospitalization and suicide attempt.  Her husband having Non-Hodgkin's Lymphoma and is sterile.  Problem List: Patient Active Problem List   Diagnosis Date Noted  . Abdominal pain 11/03/2016  . Diarrhea 10/21/2016  . Diarrhea of presumed infectious origin 10/21/2016  . Tobacco use disorder 05/13/2016  . Dysuria 05/15/2015  . Cervical intraepithelial neoplasia grade III  with severe dysplasia 01/23/2015    Past Medical History: Past Medical History:  Diagnosis Date  . Acid reflux disease   . Arthritis   . Bipolar 1 disorder ([place])   . Cervical cancer ([place])   . IBS (irritable bowel syndrome)   . Panic attack   . Seizures ([place])      Past Surgical History: Past Surgical History:  Procedure Laterality Date  . biospy    . CERVICAL BIOPSY      Family History: Family History  Problem Relation Age of Onset  . COPD Father   . Heart disease Father   . Hypertension Maternal Grandfather   . Heart disease Maternal Grandmother   . Diverticulosis Maternal Grandmother   . Stroke Maternal Aunt     Social History: Social History   Socioeconomic History  . Marital status: Married    Spouse name: Not on file  . Number of children: Not on file  . Years of education: Not on file  . Highest education level: Not on file  Social Needs  . Financial resource strain: Not on file  . Food insecurity - worry: Not on file  . Food insecurity - inability: Not on file  . Transportation needs - medical: Not on file  . Transportation needs - non-medical: Not on file  Occupational History  . Not on file  Tobacco Use  . Smoking status: Current Every Day Smoker    Packs/day: 1.00    Years: 15.00    Pack years: 15.00    Types: Cigarettes  . Smokeless tobacco: Former Systems developer    Quit date: 01/22/2005  Substance and Sexual Activity  . Alcohol use: Yes    Comment: occasional beer  . Drug use: No  . Sexual activity: Yes  Other Topics Concern  . Not on file  Social History Narrative  . Not on file    Allergies: Allergies  Allergen Reactions  . Dilaudid [Hydromorphone Hcl]   . Morphine And Related   . Paxil [Paroxetine Hcl]     Current Medications: Current Outpatient Medications  Medication Sig Dispense Refill  . acetaminophen (TYLENOL) 325 MG suppository Place 325 mg rectally every 8 (eight) hours as needed.    Marland Kitchen ibuprofen (ADVIL,MOTRIN) 200 MG tablet Take 200 mg by mouth every 6 (six) hours as needed.     No current facility-administered medications for this visit.     Review of Systems General: no complaints  HEENT: no complaints  Lungs: cough  Cardiac: no complaints  GI: no complaints  GU: no complaints   Musculoskeletal: no complaints  Extremities: no complaints  Skin: no complaints  Neuro: no complaints  Endocrine: no complaints  Psych: no complaints       Objective:  Physical Examination:  BP 106/72   Pulse 72   Temp 99.3 F (37.4 C) (Tympanic)   Resp 18   Ht 5\' 5"  (1.651 m)   Wt 173 lb 1.6 oz (78.5 kg)   BMI 28.81 kg/m   General appearance: alert, cooperative and appears stated age HEENT:PERRLA, sclera clear, anicteric, neck supple with midline trachea and thyroid without masses Lungs: clear to A and P Heart: regular rate and rhythm, no gallops or murmmers.  Lymph node survey: inguinal nodes negative Abdomen: soft, no masses or hernias. Non tender. Extremities: no edema or lesions  Neurological exam reveals: alert, oriented, normal speech, no focal findings or movement disorder noted.  Pelvic: exam chaperoned by nurse;  Vulva: normal appearing vulva with no masses, tenderness  or lesions; Vagina: normal; Cervix: flush with upper vault and feels normal. No palpable lesions; Adnexa: mild lower abdominal tenderness, ; Uterus: uterus is normal size, shape, consistency and nontender;  Rectal: confirms.     Assessment:  MARGO LAMA is a 31 y.o. female with a history of CIN III with microinvasion on LEEP in 2/16.  Cone biopsy 4/16 with CIN III with positive ectocervical margin from 9-12 o'clock. She does not want to proceed with hysterectomy unless it is absolutely needed.  Although she had a positive ectocervical margin, it is possible that all the CIN III was all removed. Normal PAP and HR HPV in 9/16, 3/17, 3/18.    Medical comorbidities: Tobacco usage, psych issues.   Plan:   Problem List Items Addressed This Visit    None    Visit Diagnoses    Carcinoma in situ of cervix, unspecified location    -  Primary     Will order urinalysis and culture to rule out UTI.  Discussed use of OCs to regulate DUB and pain in view of negative CT scan, but she is heavy smoker.    Will repeat CT scan in view of pulmonary nodule noted on scan earlier this year.   According to ASCCP guidelines she needs Pap/HPV cotesting at 12 and 24 months and this was done today. She will RTC in 6 months due to positive ectocervical margin and microinvasion on LEEP.    She is comfortable with the plan and had her questions answered.   Mellody Drown, MD

## 2017-06-29 LAB — PAP LB AND HPV HIGH-RISK
HPV, HIGH-RISK: NEGATIVE
PAP Smear Comment: 0

## 2017-06-30 ENCOUNTER — Ambulatory Visit: Payer: Self-pay | Admitting: Urology

## 2017-06-30 ENCOUNTER — Encounter: Payer: Self-pay | Admitting: Urology

## 2017-07-01 ENCOUNTER — Ambulatory Visit: Admission: RE | Admit: 2017-07-01 | Payer: Medicaid Other | Source: Ambulatory Visit

## 2017-07-14 ENCOUNTER — Telehealth: Payer: Self-pay

## 2017-07-14 NOTE — Telephone Encounter (Signed)
  Oncology Nurse Navigator Documentation Noted that Terry Walters did not show for Chest Ct. She states she was not notified of appointment. Scheduling asked to reschedule and notify her on mobile number. Navigator Location: [facility] (07/14/17 1100)   )Navigator Encounter Type: Telephone (07/14/17 1100) Telephone: Outgoing Call (07/14/17 1100)                                                  Time Spent with Patient: 15 (07/14/17 1100)

## 2017-07-20 ENCOUNTER — Ambulatory Visit: Payer: Medicaid Other | Attending: Obstetrics and Gynecology

## 2017-07-22 ENCOUNTER — Telehealth: Payer: Self-pay

## 2017-07-22 NOTE — Telephone Encounter (Signed)
Called and spoke with Ms. Ignatowski regarding missed CT scan x2 for lung nodule follow up and missing her Urology consult for persistent blood in urinalysis. The importance of these was highly stressed to her. She verbalized that she understood the reasons and importance of these. She was provided the scheduling phone number [phone] to call and have her CT rescheduled and also [facility] Urology number [phone] to have consult rescheduled. Stressed the importance of these again with her. Oncology Nurse Navigator Documentation  Navigator Location: [facility] (07/22/17 1500)   )Navigator Encounter Type: Telephone (07/22/17 1500) Telephone: Outgoing Call (07/22/17 1500)                                                  Time Spent with Patient: 15 (07/22/17 1500)

## 2017-08-04 ENCOUNTER — Ambulatory Visit
Admission: RE | Admit: 2017-08-04 | Discharge: 2017-08-04 | Disposition: A | Discharge status: Home / Self Care | Payer: Medicaid Other | Source: Ambulatory Visit | Attending: Obstetrics and Gynecology | Admitting: Obstetrics and Gynecology

## 2017-08-04 ENCOUNTER — Other Ambulatory Visit: Payer: Self-pay

## 2017-08-04 DIAGNOSIS — R911 Solitary pulmonary nodule: Secondary | ICD-10-CM | POA: Diagnosis present

## 2017-08-04 DIAGNOSIS — R918 Other nonspecific abnormal finding of lung field: Secondary | ICD-10-CM | POA: Diagnosis not present

## 2017-08-05 ENCOUNTER — Telehealth: Payer: Self-pay

## 2017-08-05 ENCOUNTER — Encounter: Payer: Self-pay | Admitting: Urology

## 2017-08-05 ENCOUNTER — Ambulatory Visit (INDEPENDENT_AMBULATORY_CARE_PROVIDER_SITE_OTHER): Payer: Medicaid Other | Admitting: Urology

## 2017-08-05 VITALS — BP 119/73 | HR 93 | Ht 65.0 in | Wt 165.0 lb

## 2017-08-05 DIAGNOSIS — R3129 Other microscopic hematuria: Secondary | ICD-10-CM

## 2017-08-05 LAB — URINALYSIS, COMPLETE
BILIRUBIN UA: NEGATIVE
GLUCOSE, UA: NEGATIVE
Ketones, UA: NEGATIVE
NITRITE UA: NEGATIVE
PROTEIN UA: NEGATIVE
Specific Gravity, UA: 1.02 (ref 1.005–1.030)
UUROB: 1 mg/dL (ref 0.2–1.0)
pH, UA: 8.5 — ABNORMAL HIGH (ref 5.0–7.5)

## 2017-08-05 LAB — MICROSCOPIC EXAMINATION

## 2017-08-05 MED ORDER — DIAZEPAM 10 MG PO TABS: ORAL_TABLET | ORAL | 0 refills | Status: DC

## 2017-08-05 NOTE — Progress Notes (Signed)
08/05/2017 2:26 PM   Terry Walters 11-06-1985 [id number]  Referring provider: Verlon Au, NP [place], [place] [address]  Chief Complaint  Patient presents with  . Hematuria    New Patient    HPI: Terry Walters is a 31 year old female seen at the request of Beckey Rutter for evaluation of microhematuria.  She has had microhematuria intermittently since 2016.  She has intermittent urinary frequency and dysuria however has had multiple cultures that have only grown mixed flora.  She is followed in oncology for cervical intraepithelial neoplasia with severe dysplasia.  She has occasional suprapubic discomfort.  She denies gross hematuria.  She is a 1 pack/day smoker.  A CT scan of the abdomen and pelvis performed April 2018 showed no genitourinary abnormalities.   PMH: Past Medical History:  Diagnosis Date  . Acid reflux disease   . Arthritis   . Bipolar 1 disorder ([place])   . Cervical cancer ([place])   . IBS (irritable bowel syndrome)   . Panic attack   . Seizures [facility])     Surgical History: Past Surgical History:  Procedure Laterality Date  . biospy    . CERVICAL BIOPSY      Home Medications:  Allergies as of 08/05/2017      Reactions   Dilaudid [hydromorphone Hcl]    Morphine And Related    Paxil [paroxetine Hcl]       Medication List        Accurate as of 08/05/17  2:26 PM. Always use your most recent med list.          acetaminophen 325 MG suppository Commonly known as:  TYLENOL Place 325 mg rectally every 8 (eight) hours as needed.   ibuprofen 200 MG tablet Commonly known as:  ADVIL,MOTRIN Take 200 mg by mouth every 6 (six) hours as needed.       Allergies:  Allergies  Allergen Reactions  . Dilaudid [Hydromorphone Hcl]   . Morphine And Related   . Paxil [Paroxetine Hcl]     Family History: Family History  Problem Relation Age of Onset  . COPD Father   . Heart disease Father   . Hypertension Maternal Grandfather   . Heart  disease Maternal Grandmother   . Diverticulosis Maternal Grandmother   . Cervical cancer Maternal Grandmother   . Stroke Maternal Aunt   . Cancer Maternal Aunt     Social History:  reports that she has been smoking cigarettes.  She has a 15.00 pack-year smoking history. She quit smokeless tobacco use about 12 years ago. She reports that she drinks alcohol. She reports that she does not use drugs.  ROS: UROLOGY Frequent Urination?: Yes Hard to postpone urination?: No Burning/pain with urination?: Yes Get up at night to urinate?: No Leakage of urine?: No Urine stream starts and stops?: No Trouble starting stream?: No Do you have to strain to urinate?: No Blood in urine?: Yes Urinary tract infection?: Yes Sexually transmitted disease?: No Injury to kidneys or bladder?: No Painful intercourse?: No Weak stream?: No Currently pregnant?: No Vaginal bleeding?: No Last menstrual period?: n  Gastrointestinal Nausea?: No Vomiting?: No Indigestion/heartburn?: Yes Diarrhea?: No Constipation?: No  Constitutional Fever: No Night sweats?: No Weight loss?: No Fatigue?: No  Skin Skin rash/lesions?: No Itching?: No  Eyes Blurred vision?: No Double vision?: No  Ears/Nose/Throat Sore throat?: No Sinus problems?: No  Hematologic/Lymphatic Swollen glands?: No Easy bruising?: No  Cardiovascular Leg swelling?: No Chest pain?: No  Respiratory Cough?:  Yes Shortness of breath?: No  Endocrine Excessive thirst?: No  Musculoskeletal Back pain?: Yes Joint pain?: Yes  Neurological Headaches?: Yes Dizziness?: No  Psychologic Depression?: Yes Anxiety?: Yes  Physical Exam: BP 119/73   Pulse 93   Ht 5\' 5"  (1.651 m)   Wt 165 lb (74.8 kg)   LMP 07/18/2017 (Exact Date)   BMI 27.46 kg/m   Constitutional:  Alert and oriented, No acute distress. HEENT: [place] AT, moist mucus membranes.  Trachea midline, no masses. Cardiovascular: No clubbing, cyanosis, or  edema. Respiratory: Normal respiratory effort, no increased work of breathing. GI: Abdomen is soft, nontender, nondistended, no abdominal masses GU: No CVA tenderness.  A CT scan of the abdomen and pelvis with contrast performed April 2018 showed no genitourinary abnormalities. Skin: No rashes, bruises or suspicious lesions. Lymph: No cervical or inguinal adenopathy. Neurologic: Grossly intact, no focal deficits, moving all 4 extremities. Psychiatric: Normal mood and affect.  Laboratory Data: Lab Results  Component Value Date   WBC 17.4 (H) 05/28/2016   HGB 14.8 05/28/2016   HCT 41.7 05/28/2016   MCV 86.9 05/28/2016   PLT 206 05/28/2016    Lab Results  Component Value Date   CREATININE 0.81 05/28/2016     Urinalysis Dipstick 1+ blood/trace leukocytes  microscopy: 3-10 RBC   Assessment & Plan:    1. Microscopic hematuria Persistent microhematuria with previous negative upper tract imaging.  Cystoscopy is recommended for evaluation of her lower urinary tract.  She would like Valium prior to procedure and Rx was sent to her pharmacy.  She will have a driver.  - Urinalysis, Complete    Abbie Sons, MD  [facility] [address], [place], [place] [address] [phone]

## 2017-08-05 NOTE — Telephone Encounter (Signed)
Called and notified Terry Walters of her CT results. Per Dr. Fransisca Connors we will repeat scan in one year due to high risk history of smoking and cervical cancer.. Order placed for imaging. She will be notified of date/time once scheduled. Oncology Nurse Navigator Documentation  Navigator Location: [facility] (08/05/17 1400)   )Navigator Encounter Type: Diagnostic Results (08/05/17 1400) Telephone: Outgoing Call;Diagnostic Results (08/05/17 1400)                                                  Time Spent with Patient: 15 (08/05/17 1400)

## 2017-09-01 ENCOUNTER — Other Ambulatory Visit: Payer: Medicaid Other | Admitting: Urology

## 2017-09-22 ENCOUNTER — Emergency Department: Payer: Medicaid Other

## 2017-09-22 ENCOUNTER — Encounter: Payer: Self-pay | Admitting: Emergency Medicine

## 2017-09-22 ENCOUNTER — Emergency Department
Admission: EM | Admit: 2017-09-22 | Discharge: 2017-09-22 | Disposition: A | Discharge status: Home / Self Care | Payer: Medicaid Other | Attending: Emergency Medicine | Admitting: Emergency Medicine

## 2017-09-22 DIAGNOSIS — J40 Bronchitis, not specified as acute or chronic: Secondary | ICD-10-CM

## 2017-09-22 DIAGNOSIS — J209 Acute bronchitis, unspecified: Secondary | ICD-10-CM | POA: Diagnosis not present

## 2017-09-22 DIAGNOSIS — N3001 Acute cystitis with hematuria: Secondary | ICD-10-CM | POA: Insufficient documentation

## 2017-09-22 DIAGNOSIS — F17210 Nicotine dependence, cigarettes, uncomplicated: Secondary | ICD-10-CM | POA: Insufficient documentation

## 2017-09-22 DIAGNOSIS — R05 Cough: Secondary | ICD-10-CM | POA: Diagnosis present

## 2017-09-22 LAB — URINALYSIS, COMPLETE (UACMP) WITH MICROSCOPIC
BILIRUBIN URINE: NEGATIVE
Glucose, UA: NEGATIVE mg/dL
Ketones, ur: NEGATIVE mg/dL
Nitrite: NEGATIVE
Protein, ur: 30 mg/dL — AB
SPECIFIC GRAVITY, URINE: 1.029 (ref 1.005–1.030)
pH: 6 (ref 5.0–8.0)

## 2017-09-22 LAB — INFLUENZA PANEL BY PCR (TYPE A & B)
INFLAPCR: NEGATIVE
INFLBPCR: NEGATIVE

## 2017-09-22 LAB — POCT PREGNANCY, URINE: PREG TEST UR: NEGATIVE

## 2017-09-22 MED ORDER — AZITHROMYCIN 250 MG PO TABS: ORAL_TABLET | ORAL | 0 refills | Status: DC

## 2017-09-22 MED ORDER — CEPHALEXIN 500 MG PO CAPS: 500.0000 mg | ORAL_CAPSULE | Freq: Two times a day (BID) | ORAL | 0 refills | Status: AC

## 2017-09-22 MED ORDER — SODIUM CHLORIDE 0.9 % IV BOLUS (SEPSIS): 1000.0000 mL | Freq: Once | INTRAVENOUS | Status: DC

## 2017-09-22 MED ORDER — BENZONATATE 100 MG PO CAPS: 100.0000 mg | ORAL_CAPSULE | Freq: Three times a day (TID) | ORAL | 0 refills | Status: DC | PRN

## 2017-09-22 NOTE — ED Triage Notes (Signed)
Pt reports flu like sx's for the past few days. Pt reports daughter recently diagnosed with flu and she is concerned she has it now. Pt reports productive cough that is yellow.

## 2017-09-22 NOTE — ED Provider Notes (Signed)
[facility] Emergency Department Provider Note  ____________________________________________  Time seen: Approximately 3:23 PM  I have reviewed the triage vital signs and the nursing notes.   HISTORY  Chief Complaint Cough and Nasal Congestion    HPI Terry Walters is a 32 y.o. female that presents the emergency department for evaluation of nonproductive cough, body aches, subjective fevers for 3 days.  She also has had foul-smelling urinating urine and dysuria for weeks.  She has had family members with influenza and other colds recently.  She smokes a pack of cigarettes per day and smokes more if she is mad.  She was found to have a lung nodule in December and is supposed to follow-up in 1 year for repeat CT.  No shortness of breath, chest pain, flank pain, nausea, vomiting.   Past Medical History:  Diagnosis Date  . Acid reflux disease   . Arthritis   . Bipolar 1 disorder ([place])   . Cervical cancer ([place])   . IBS (irritable bowel syndrome)   . Panic attack   . Seizures [facility])     Patient Active Problem List   Diagnosis Date Noted  . Microscopic hematuria 08/05/2017  . Abdominal pain 11/03/2016  . Diarrhea 10/21/2016  . Diarrhea of presumed infectious origin 10/21/2016  . Tobacco use disorder 05/13/2016  . Dysuria 05/15/2015  . Cervical intraepithelial neoplasia grade III with severe dysplasia 01/23/2015    Past Surgical History:  Procedure Laterality Date  . biospy    . CERVICAL BIOPSY      Prior to Admission medications   Medication Sig Start Date End Date Taking? Authorizing Provider  acetaminophen (TYLENOL) 325 MG suppository Place 325 mg rectally every 8 (eight) hours as needed.    [provider]  azithromycin (ZITHROMAX Z-PAK) 250 MG tablet Take 2 tablets (500 mg) on  Day 1,  followed by 1 tablet (250 mg) once daily on Days 2 through 5. 09/22/17   Laban Emperor, PA-C  benzonatate (TESSALON PERLES) 100 MG capsule Take 1 capsule (100  mg total) by mouth 3 (three) times daily as needed for cough. 09/22/17 09/22/18  Laban Emperor, PA-C  cephALEXin (KEFLEX) 500 MG capsule Take 1 capsule (500 mg total) by mouth 2 (two) times daily for 10 days. 09/22/17 10/02/17  Laban Emperor, PA-C  diazepam (VALIUM) 10 MG tablet 1 tab po 30 min prior to procedure 08/05/17   Stoioff, Ronda Fairly, MD  ibuprofen (ADVIL,MOTRIN) 200 MG tablet Take 200 mg by mouth every 6 (six) hours as needed.    [provider]    Allergies Dilaudid [hydromorphone hcl]; Morphine and related; and Paxil [paroxetine hcl]  Family History  Problem Relation Age of Onset  . COPD Father   . Heart disease Father   . Hypertension Maternal Grandfather   . Heart disease Maternal Grandmother   . Diverticulosis Maternal Grandmother   . Cervical cancer Maternal Grandmother   . Stroke Maternal Aunt   . Cancer Maternal Aunt     Social History Social History   Tobacco Use  . Smoking status: Current Every Day Smoker    Packs/day: 1.00    Years: 15.00    Pack years: 15.00    Types: Cigarettes  . Smokeless tobacco: Former Systems developer    Quit date: 01/22/2005  Substance Use Topics  . Alcohol use: Yes    Comment: occasional beer  . Drug use: No     Review of Systems  Eyes: No visual changes. No discharge. ENT:  Negative for congestion and rhinorrhea. Cardiovascular: No chest pain. Respiratory: Positive for cough. No SOB. Gastrointestinal:  No nausea, no vomiting.   Musculoskeletal: Positive for body aches. Skin: Negative for rash, abrasions, lacerations, ecchymosis.   ____________________________________________   PHYSICAL EXAM:  VITAL SIGNS: ED Triage Vitals  Enc Vitals Group     BP 09/22/17 1050 117/67     Pulse Rate 09/22/17 1050 78     Resp 09/22/17 1050 20     Temp 09/22/17 1050 98.5 F (36.9 C)     Temp Source 09/22/17 1050 Oral     SpO2 09/22/17 1050 97 %     Weight 09/22/17 1051 150 lb (68 kg)     Height 09/22/17 1051 5\' 5"  (1.651 m)     Head  Circumference --      Peak Flow --      Pain Score 09/22/17 1051 6     Pain Loc --      Pain Edu? --      Excl. in [place]? --      Constitutional: Alert and oriented. Well appearing and in no acute distress. Eyes: Conjunctivae are normal. PERRL. EOMI. No discharge. Head: Atraumatic. ENT: No frontal and maxillary sinus tenderness.      Ears: Tympanic membranes pearly gray with good landmarks. No discharge.      Nose: Negative congestion/rhinnorhea.      Mouth/Throat: Mucous membranes are moist. Oropharynx non-erythematous. Tonsils not enlarged. No exudates. Uvula midline. Neck: No stridor.   Hematological/Lymphatic/Immunilogical: No cervical lymphadenopathy. Cardiovascular: Normal rate, regular rhythm.  Good peripheral circulation. Respiratory: Normal respiratory effort without tachypnea or retractions. Lungs CTAB. Good air entry to the bases with no decreased or absent breath sounds. Gastrointestinal: Bowel sounds 4 quadrants. Soft and nontender to palpation. No guarding or rigidity. No palpable masses. No distention.  No CVA tenderness. Musculoskeletal: Full range of motion to all extremities. No gross deformities appreciated. Neurologic:  Normal speech and language. No gross focal neurologic deficits are appreciated.  Skin:  Skin is warm, dry and intact. No rash noted. Psychiatric: Mood and affect are normal. Speech and behavior are normal. Patient exhibits appropriate insight and judgement.   ____________________________________________   LABS (all labs ordered are listed, but only abnormal results are displayed)  Labs Reviewed  URINALYSIS, COMPLETE (UACMP) WITH MICROSCOPIC - Abnormal; Notable for the following components:      Result Value   Color, Urine AMBER (*)    APPearance TURBID (*)    Hgb urine dipstick SMALL (*)    Protein, ur 30 (*)    Leukocytes, UA TRACE (*)    Bacteria, UA MANY (*)    Squamous Epithelial / LPF TOO NUMEROUS TO COUNT (*)    All other components  within normal limits  URINE CULTURE  INFLUENZA PANEL BY PCR (TYPE A & B)  POC URINE PREG, ED  POCT PREGNANCY, URINE   ____________________________________________  EKG   ____________________________________________  RADIOLOGY Robinette Haines, personally viewed and evaluated these images (plain radiographs) as part of my medical decision making, as well as reviewing the written report by the radiologist.  Dg Chest 2 View  Result Date: 09/22/2017 CLINICAL DATA:  Cough, generalized body ache and intermittent fevers for several days. EXAM: CHEST  2 VIEW COMPARISON:  CT chest 08/04/2017.  PA and lateral chest 05/07/2016. FINDINGS: Lungs clear. Heart size normal. No pneumothorax or pleural fluid. No bony abnormality. IMPRESSION: Negative chest. Electronically Signed   By: Inge Rise M.D.   On:  09/22/2017 12:40    ____________________________________________    PROCEDURES  Procedure(s) performed:    Procedures    Medications - No data to display   ____________________________________________   INITIAL IMPRESSION / ASSESSMENT AND PLAN / ED COURSE  Pertinent labs & imaging results that were available during my care of the patient were reviewed by me and considered in my medical decision making (see chart for details).  Review of the [place] CSRS was performed in accordance of the [place] prior to dispensing any controlled drugs.   Patient's diagnosis is consistent with bronchitis and UTI. Vital signs and exam are reassuring.  Chest x-ray negative for acute cardiopulmonary processes.  Patient has had dysuria and foul-smelling urine for several weeks.  Urinalysis consistent with infection.  Patient appears well and is staying well hydrated.  She is talkative.  Patient feels comfortable going home. Patient will be discharged home with prescriptions for Keflex and azithromycin. Patient is to follow up with PCP as needed or otherwise directed. Patient is given ED precautions to return  to the ED for any worsening or new symptoms.     ____________________________________________  FINAL CLINICAL IMPRESSION(S) / ED DIAGNOSES  Final diagnoses:  Bronchitis  Acute cystitis with hematuria      NEW MEDICATIONS STARTED DURING THIS VISIT:  ED Discharge Orders        Ordered    azithromycin (ZITHROMAX Z-PAK) 250 MG tablet     09/22/17 1346    cephALEXin (KEFLEX) 500 MG capsule  2 times daily     09/22/17 1346    benzonatate (TESSALON PERLES) 100 MG capsule  3 times daily PRN     09/22/17 1346          This chart was dictated using voice recognition software/Dragon. Despite best efforts to proofread, errors can occur which can change the meaning. Any change was purely unintentional.    Laban Emperor, PA-C 09/22/17 [place], Randall An, MD 09/23/17 [phone]

## 2017-09-22 NOTE — ED Notes (Signed)
Patient ambulatory to lobby with steady gait and NAD noted. Verbalized understanding of discharge instructions and follow-up care.  

## 2017-09-22 NOTE — ED Notes (Signed)
Pt reports cough, generalized body aches, intermittent fevers, and intermittent abdominal pain for several days. Pt states recent exposure to strep and flu in immediate family. Pt is alert and oriented and playing on her phone during assessment, NAD noted at this time.

## 2017-09-24 LAB — URINE CULTURE

## 2017-12-22 ENCOUNTER — Other Ambulatory Visit: Payer: Self-pay | Admitting: Nurse Practitioner

## 2017-12-22 ENCOUNTER — Inpatient Hospital Stay: Payer: Medicaid Other | Attending: Obstetrics and Gynecology | Admitting: Nurse Practitioner

## 2017-12-22 VITALS — BP 114/74 | HR 89 | Temp 99.4°F | Resp 18 | Ht 65.0 in | Wt 180.0 lb

## 2017-12-22 DIAGNOSIS — D069 Carcinoma in situ of cervix, unspecified: Secondary | ICD-10-CM | POA: Diagnosis not present

## 2017-12-22 DIAGNOSIS — F17210 Nicotine dependence, cigarettes, uncomplicated: Secondary | ICD-10-CM

## 2017-12-22 DIAGNOSIS — C8190 Hodgkin lymphoma, unspecified, unspecified site: Secondary | ICD-10-CM | POA: Insufficient documentation

## 2017-12-22 DIAGNOSIS — F319 Bipolar disorder, unspecified: Secondary | ICD-10-CM | POA: Diagnosis not present

## 2017-12-22 DIAGNOSIS — R87810 Cervical high risk human papillomavirus (HPV) DNA test positive: Secondary | ICD-10-CM | POA: Diagnosis not present

## 2017-12-22 DIAGNOSIS — R05 Cough: Secondary | ICD-10-CM | POA: Diagnosis not present

## 2017-12-22 NOTE — Progress Notes (Signed)
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 female who returns to clinic today for continued surveillance for history of cervical dysplasia/stage I A1 squamous cell status post LEEP.  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   06/2017-normal Pap/HPV.  At that time she reported UTI-like symptoms.  UA was negative for infection but noted to have persistent microhematuria since 2016.  She was referred to urology and saw Dr. Bernardo Heater on 08/05/2017.  Given previous negative imaging, cystoscopy was recommended for further evaluation.  She has not followed up.  08/04/17-CT chest without contrast for reassessment of pulmonary nodule 1. Stable right middle lobe subpleural nodule measuring 6 mm compared to 11/17/2016. 2. There is a 3 mm nodule in the right upper lobe.  Not imaged on previous studies.  Noncontrast chest CT can be considered in 12 months if patient is high risk.  This recommendation follows the consensus statement: Guidelines for management of incidental pulmonary nodules detected on CT images: From the Fleischner Society 2017; radiology 2017; 284: 228-243.   She was seen in ER on 09/22/17 nonproductive cough, body aches, fever, and dysuria. Diagnosed with bronchitis and UTI, although Culture suggested re-collection. She was treated with keflex and azithromycin and directed to follow-up with PCP.    Today she reports feeling continually tired and continues to have intermittent non-productive cough. She continues to smoke 1/2-1 ppd and has not been exercising or trying to lose weight. She has intermittent back pain and vaginal discharge. She has not returned to see Dr. Bernardo Heater for cystoscopy and has not established care  with a PCP. She is accompanied by her husband.    History:   This G1 P1 patient was seen by Dr Janice Norrie for evaluation of a PAP 04/25/14 showing ASCUS and HPV HR+. She also had heavy menstrual periods, some bleeding in between and inability to conceive. Her husband has a low sperm count due to chemotherapy for recurrent Hodgkins disease.   Dr. Laymond Purser in [place] performed a colposcopy in 10/15.  A large mosaic lesion was noted on the anterior cervix and biopsy showed high-grade squamous intraepithelial lesion.  A LEEP was planned and the patient also requested a tubal ligation.  After discussion of various contraceptive methods the patient elected to have a Mirena IUD inserted as it would help with her menorrhagia and would be reversible.   On September 25, 2014 she underwent LEEP, hysteroscopy and Mirena IUD insertion. The LEEP was done in three pieces (anterior, posterior, endocervix). 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  Margins were positive and there was endocervical gland involvement.  This pathology was reviewed and confirmed at the [facility] by Dr Alisia Ferrari.  Endometrium was proliferative.  She underwent cone biopsy at [facility] with Dr Fransisca Connors on 11/21/14. Mirena IUD was removed at that time.     A. ECTOCERVIX; CONIZATION:  - HIGH-GRADE SQUAMOUS INTRAEPITHELIAL LESION (CIN 2-3).  - AN ECTOCERVICAL MARGIN IS INVOLVED IN THE 9 TO 12:00 QUADRANT.  - THE ENDOCERVICAL MARGINS OF EXCISION ARE NEGATIVE   B. ENDOCERVIX; CONIZATION:  - BENIGN FIBROUS TISSUE.   9/16 PAP normal and HR HPV was  negative.  11/13/2015 Pap negative; HRHPV negative.  3/18 Normal PAP/HPV.  3/18 Pelvic US  and CT scan to work up pain basically normal.  CT scan abd/pelvis IMPRESSION: No acute findings within the abdomen or pelvis.  4/18 Chest CT 6 mm pulmonary nodule in  inferior right middle lobe. Non-contrast chest CT at 6-12 months is recommended. If the nodule is stable at time of repeat CT, then future CT at 18-24 months (from today's scan) is considered optional for low-risk patients, but is recommended for high-risk patients. This recommendation follows the consensus statement: Guidelines for Management of Incidental Pulmonary Nodules Detected on CT Images: From the Fleischner Society 2017; Radiology 2017; 284:228-243.   She has a history of tobacco use, a learning disorder, seizures and significant bipolar mood disorder and is taking Celexa. She has a history of depression with hospitalization and suicide attempt.  Her husband having Non-Hodgkin's Lymphoma and is sterile.  Problem List: Patient Active Problem List   Diagnosis Date Noted  . Microscopic hematuria 08/05/2017  . Abdominal pain 11/03/2016  . Diarrhea 10/21/2016  . Diarrhea of presumed infectious origin 10/21/2016  . Tobacco use disorder 05/13/2016  . Dysuria 05/15/2015  . Cervical intraepithelial neoplasia grade III with severe dysplasia 01/23/2015    Past Medical History: Past Medical History:  Diagnosis Date  . Acid reflux disease   . Arthritis   . Bipolar 1 disorder ([place])   . Cervical cancer ([place])   . IBS (irritable bowel syndrome)   . Lung nodules   . Panic attack   . Seizures ([place])     Past Surgical History: Past Surgical History:  Procedure Laterality Date  . biospy    . CERVICAL BIOPSY      Family History: Family History  Problem Relation Age of Onset  . COPD Father   . Heart disease Father   . Hypertension Maternal Grandfather   . Heart disease Maternal Grandmother   . Diverticulosis Maternal Grandmother   . Cervical cancer Maternal Grandmother   . Stroke Maternal Aunt   . Cancer Maternal Aunt     Social History: Social History   Socioeconomic History  . Marital status: Married    Spouse name: Not on file  . Number of children: Not on file  . Years  of education: Not on file  . Highest education level: Not on file  Occupational History  . Not on file  Social Needs  . Financial resource strain: Not on file  . Food insecurity:    Worry: Not on file    Inability: Not on file  . Transportation needs:    Medical: Not on file    Non-medical: Not on file  Tobacco Use  . Smoking status: Current Every Day Smoker    Packs/day: 1.50    Years: 15.00    Pack years: 22.50    Types: Cigarettes  . Smokeless tobacco: Former Systems developer    Quit date: 01/22/2005  Substance and Sexual Activity  . Alcohol use: Yes    Comment: occasional beer rare about 5 years ago  . Drug use: No  . Sexual activity: Yes  Lifestyle  . Physical activity:    Days per week: Not on file    Minutes per session: Not on file  . Stress: Not on file  Relationships  . Social connections:    Talks on phone: Not on file    Gets together: Not on file    Attends religious service: Not on file  Active member of club or organization: Not on file    Attends meetings of clubs or organizations: Not on file    Relationship status: Not on file  . Intimate partner violence:    Fear of current or ex partner: Not on file    Emotionally abused: Not on file    Physically abused: Not on file    Forced sexual activity: Not on file  Other Topics Concern  . Not on file  Social History Narrative  . Not on file    Allergies: Allergies  Allergen Reactions  . Dilaudid [Hydromorphone Hcl]   . Morphine And Related   . Paxil [Paroxetine Hcl]     Current Medications: Current Outpatient Medications  Medication Sig Dispense Refill  . acetaminophen (TYLENOL) 325 MG suppository Place 325 mg rectally every 8 (eight) hours as needed.    Marland Kitchen ibuprofen (ADVIL,MOTRIN) 200 MG tablet Take 200 mg by mouth every 6 (six) hours as needed.    Marland Kitchen azithromycin (ZITHROMAX Z-PAK) 250 MG tablet Take 2 tablets (500 mg) on  Day 1,  followed by 1 tablet (250 mg) once daily on Days 2 through 5. 6 each 0  .  benzonatate (TESSALON PERLES) 100 MG capsule Take 1 capsule (100 mg total) by mouth 3 (three) times daily as needed for cough. 30 capsule 0  . diazepam (VALIUM) 10 MG tablet 1 tab po 30 min prior to procedure 1 tablet 0   No current facility-administered medications for this visit.     Review of Systems General: fatigue  HEENT: no complaints  Lungs: cough, non-productive, intermittent, unchanged  Cardiac: no complaints  GI: no complaints  GU: occassional vaginal discharge, thin, no-odor, no itching  Musculoskeletal: low back pain (chronic and unchanged)  Extremities: no complaints  Skin: no complaints  Neuro: no complaints  Endocrine: no complaints  Psych: no complaints       Objective:  Physical Examination:  BP 114/74   Pulse 89   Temp 99.4 F (37.4 C) (Oral)   Resp 18   Ht 5\' 5"  (1.651 m)   Wt 180 lb (81.6 kg)   BMI 29.95 kg/m   GENERAL: Patient is a well appearing female in no acute distress. Accompanied by husband.  HEENT:  Sclerae anicteric.  Oropharynx clear and moist. No ulcerations or evidence of oropharyngeal candidiasis. Neck is supple. Poor dentition.  NODES:  No cervical, supraclavicular, or axillary lymphadenopathy palpated.  LUNGS:  Clear to auscultation bilaterally.  No wheezes or rhonchi. HEART:  Regular rate and rhythm. No murmur appreciated. ABDOMEN:  Soft, nontender. Positive, normoactive bowel sounds. No organomegaly palpated. MSK:  No focal spinal tenderness to palpation. Full range of motion bilaterally in the upper extremities. EXTREMITIES:  No peripheral edema.   SKIN:  Clear with no obvious rashes or skin changes. No nail dyscrasia. NEURO:  Nonfocal. Well oriented.  Appropriate affect.  Pelvic: Exam Chaperoned by RN EGBUS: single white lesion on right labia minor. Image available under media tab.  Cervix: flush with upper vault. No palpable lesions. Changes consistent with prior LEEP.  Vagina: no lesions, no discharge or bleeding Uterus:  normal size, nontender, mobile Adnexa: no palpable masses. Non-tender to palpation Rectovaginal: confirmatory    Assessment:  Terry Walters is a 32 y.o. female with a history of Stage 1A1 squamous cell carcinoma of the cervix on LEEP in 2/16.  Cone biopsy 4/16 with CIN III with positive ectocervical margin from 9-12 o'clock. She did not wish to proceed with with  hysterectomy unless it is absolutely needed.  Although she had a positive ectocervical margin, it is possible that all the CIN III was all removed. Normal PAP and HR HPV in 9/16, 3/17, 3/18, 11/18.    Medical comorbidities: Tobacco usage, psychiatric history, learning disability  Plan:   Problem List Items Addressed This Visit      Genitourinary   Cervical intraepithelial neoplasia grade III with severe dysplasia - Primary   Relevant Orders   Pap liquid-based and HPV (high risk)     Recommend returning to Urology for recommended cystoscopy. Expressed importance of this and following up on hematuria.   Repeat CT Chest in 07/2018 to follow-up on lung nodules.   Based on conservative fertility sparing management for stage IA1 cervical cancer plan to follow modified up-to-date guidelines.  There are no definitive guidelines for follow-up of patients after fertility sparing surgery.  In general cervical cytology and colposcopic examination, with or without endocervical curettage, is performed every 3 to 4 months for the first 3 years, every 6 months for the next 2 years, and annually thereafter.  She has not been undergoing ECC and all cytology has been negative thus far.  We will plan for her to return to clinic in 6 months for repeat exam, PAP and HPV.   She is comfortable with this plan and multiple questions answered   Beckey Rutter, [place], AGNP-C [place] [facility] (work cell) [phone] (office) 01/04/18 8:38 AM   Medical screening examination/treatment/procedure(s) were performed by  non-physician practitioner and as supervising physician I was immediately available for consultation/collaboration. Santiago Glad, MD

## 2017-12-22 NOTE — Progress Notes (Signed)
Pt has occ. Vaginal drainage, recurrent UTI but not one now.

## 2017-12-22 NOTE — Patient Instructions (Signed)
It was good to see you again today. As we discussed, I would like for you to return to Urology, Dr. Bernardo Heater for cystoscopy. I'd also like to encourage you to continue to decrease smoking with a goal of quitting and if you're interested we can refer you to the [facility] program. We will repeat your CT scan in December 2019 to follow-up on the lung nodule. We will call you with the results of the pap smear today. I will consult with Dr. Theora Gianotti about the 'bump' we saw and I'll call you with follow-up based on her recommendation. Otherwise, we'll plan to have you return to the clinic in 6 months for repeat exam. It was a pleasure seeing you today and thank you for allowing me to participate in your care. -Beckey Rutter, NP

## 2017-12-28 LAB — PAP LB AND HPV HIGH-RISK
HPV, high-risk: NEGATIVE
PAP SMEAR COMMENT: 0

## 2018-01-04 ENCOUNTER — Telehealth: Payer: Self-pay | Admitting: Nurse Practitioner

## 2018-01-04 NOTE — Telephone Encounter (Signed)
Called patient with results of pap which was normal.

## 2018-06-22 ENCOUNTER — Inpatient Hospital Stay: Payer: Medicaid Other

## 2018-06-22 ENCOUNTER — Telehealth: Payer: Self-pay | Admitting: Obstetrics and Gynecology

## 2018-06-22 NOTE — Telephone Encounter (Signed)
Patient No Show 06/22/18 GYN appt. No V/M avail on Home Phone. Mobile Phone not working.

## 2018-07-09 ENCOUNTER — Other Ambulatory Visit: Payer: Self-pay

## 2018-07-09 ENCOUNTER — Encounter: Payer: Self-pay | Admitting: Emergency Medicine

## 2018-07-09 ENCOUNTER — Emergency Department
Admission: EM | Admit: 2018-07-09 | Discharge: 2018-07-09 | Disposition: A | Discharge status: Home / Self Care | Payer: Medicaid Other | Attending: Student in an Organized Health Care Education/Training Program | Admitting: Student in an Organized Health Care Education/Training Program

## 2018-07-09 DIAGNOSIS — R070 Pain in throat: Secondary | ICD-10-CM | POA: Diagnosis present

## 2018-07-09 DIAGNOSIS — B9789 Other viral agents as the cause of diseases classified elsewhere: Secondary | ICD-10-CM | POA: Insufficient documentation

## 2018-07-09 DIAGNOSIS — R0981 Nasal congestion: Secondary | ICD-10-CM | POA: Diagnosis not present

## 2018-07-09 DIAGNOSIS — J069 Acute upper respiratory infection, unspecified: Secondary | ICD-10-CM | POA: Insufficient documentation

## 2018-07-09 DIAGNOSIS — Z8541 Personal history of malignant neoplasm of cervix uteri: Secondary | ICD-10-CM | POA: Insufficient documentation

## 2018-07-09 DIAGNOSIS — F17210 Nicotine dependence, cigarettes, uncomplicated: Secondary | ICD-10-CM | POA: Diagnosis not present

## 2018-07-09 DIAGNOSIS — R05 Cough: Secondary | ICD-10-CM | POA: Insufficient documentation

## 2018-07-09 MED ORDER — PSEUDOEPH-BROMPHEN-DM 30-2-10 MG/5ML PO SYRP: 5.0000 mL | ORAL_SOLUTION | Freq: Four times a day (QID) | ORAL | 0 refills | Status: DC | PRN

## 2018-07-09 NOTE — Discharge Instructions (Signed)
Follow-up with [facility] acute care 1 the clinics listed on your discharge papers.  Tylenol as needed for body aches.  Bromfed-DM as needed for cough and nasal congestion.  Increase fluids.

## 2018-07-09 NOTE — ED Provider Notes (Signed)
[facility] Emergency Department Provider Note  ____________________________________________   First MD Initiated Contact with Patient 07/09/18 1251     (approximate)  I have reviewed the triage vital signs and the nursing notes.   HISTORY  Chief Complaint Cough and Nasal Congestion   HPI Terry Walters is a 32 y.o. female presents to the ED with complaint of sore throat, cough and congestion for the last 4 days.  Patient has not taken any over-the-counter medication for her symptoms.  Family member in the room states that he had similar symptoms last week that have resolved.  Patient denies any other symptoms such as nausea, vomiting or diarrhea.   Past Medical History:  Diagnosis Date  . Acid reflux disease   . Arthritis   . Bipolar 1 disorder ([place])   . Cervical cancer ([place])   . IBS (irritable bowel syndrome)   . Lung nodules   . Panic attack   . Seizures [facility])     Patient Active Problem List   Diagnosis Date Noted  . Microscopic hematuria 08/05/2017  . Abdominal pain 11/03/2016  . Diarrhea 10/21/2016  . Diarrhea of presumed infectious origin 10/21/2016  . Tobacco use disorder 05/13/2016  . Dysuria 05/15/2015  . Cervical intraepithelial neoplasia grade III with severe dysplasia 01/23/2015    Past Surgical History:  Procedure Laterality Date  . biospy    . CERVICAL BIOPSY      Prior to Admission medications   Medication Sig Start Date End Date Taking? Authorizing Provider  acetaminophen (TYLENOL) 325 MG suppository Place 325 mg rectally every 8 (eight) hours as needed.    [provider]  brompheniramine-pseudoephedrine-DM 30-2-10 MG/5ML syrup Take 5 mLs by mouth 4 (four) times daily as needed. 07/09/18   Johnn Hai, PA-C  diazepam (VALIUM) 10 MG tablet 1 tab po 30 min prior to procedure 08/05/17   Stoioff, Ronda Fairly, MD  ibuprofen (ADVIL,MOTRIN) 200 MG tablet Take 200 mg by mouth every 6 (six) hours as needed.    [provider]    Allergies Dilaudid [hydromorphone hcl]; Morphine and related; and Paxil [paroxetine hcl]  Family History  Problem Relation Age of Onset  . COPD Father   . Heart disease Father   . Hypertension Maternal Grandfather   . Heart disease Maternal Grandmother   . Diverticulosis Maternal Grandmother   . Cervical cancer Maternal Grandmother   . Stroke Maternal Aunt   . Cancer Maternal Aunt     Social History Social History   Tobacco Use  . Smoking status: Current Every Day Smoker    Packs/day: 1.50    Years: 15.00    Pack years: 22.50    Types: Cigarettes  . Smokeless tobacco: Former Systems developer    Quit date: 01/22/2005  Substance Use Topics  . Alcohol use: Yes    Comment: occasional beer rare about 5 years ago  . Drug use: No    Review of Systems Constitutional: No fever/chills Eyes: No visual changes. ENT: Positive sore throat.  Positive for nasal congestion. Cardiovascular: Denies chest pain. Respiratory: Denies shortness of breath.  Positive for cough. Gastrointestinal: No abdominal pain.  No nausea, no vomiting.  No diarrhea.  Genitourinary: Negative for dysuria. Musculoskeletal: Negative for back pain. Skin: Negative for rash. Neurological: Negative for headaches, focal weakness or numbness. ____________________________________________   PHYSICAL EXAM:  VITAL SIGNS: ED Triage Vitals  Enc Vitals Group     BP 07/09/18 1235 (!) 127/56     Pulse  Rate 07/09/18 1235 86     Resp 07/09/18 1235 16     Temp 07/09/18 1235 98.8 F (37.1 C)     Temp Source 07/09/18 1235 Oral     SpO2 07/09/18 1235 95 %     Weight 07/09/18 1236 180 lb (81.6 kg)     Height 07/09/18 1236 5\' 5"  (1.651 m)     Head Circumference --      Peak Flow --      Pain Score 07/09/18 1238 0     Pain Loc --      Pain Edu? --      Excl. in [place]? --    Constitutional: Alert and oriented. Well appearing and in no acute distress. Eyes: Conjunctivae are normal.  Head: Atraumatic. Nose: Mild  congestion/rhinnorhea.  EACs are partially occluded with cerumen however TMs are visible and no erythema or injection is noted. Mouth/Throat: Mucous membranes are moist.  Oropharynx non-erythematous. Neck: No stridor.   Hematological/Lymphatic/Immunilogical: No cervical lymphadenopathy. Cardiovascular: Normal rate, regular rhythm. Grossly normal heart sounds.  Good peripheral circulation. Respiratory: Normal respiratory effort.  No retractions. Lungs CTAB. Gastrointestinal: Soft and nontender. No distention.  Neurologic:  Normal speech and language. No gross focal neurologic deficits are appreciated. No gait instability. Skin:  Skin is warm, dry and intact. No rash noted. Psychiatric: Mood and affect are normal. Speech and behavior are normal.  ____________________________________________   LABS (all labs ordered are listed, but only abnormal results are displayed)  Labs Reviewed - No data to display   PROCEDURES  Procedure(s) performed: None  Procedures  Critical Care performed: No  ____________________________________________   INITIAL IMPRESSION / ASSESSMENT AND PLAN / ED COURSE  As part of my medical decision making, I reviewed the following data within the electronic MEDICAL RECORD NUMBER Notes from prior ED visits and [place] Controlled Substance Database  Patient presents to the ED with complaint of sore throat, cough, congestion for the last 4 days.  She has not taken any over-the-counter medication for her symptoms.  Family members have had similar symptoms.  Patient is unaware of any actual fever.  Physical exam was consistent with a viral URI.  Patient was encouraged to increase fluids.  She was given a prescription for Bromfed-DM 4 times daily as needed for cough and congestion.  She is to follow-up with 1 the clinics listed on her discharge papers or [facility] acute care if needed.  Patient states that her PCP will no longer see her as she did not make appointments for  follow-up appropriately.  ____________________________________________   FINAL CLINICAL IMPRESSION(S) / ED DIAGNOSES  Final diagnoses:  Viral URI with cough     ED Discharge Orders         Ordered    brompheniramine-pseudoephedrine-DM 30-2-10 MG/5ML syrup  4 times daily PRN     07/09/18 1309           Note:  This document was prepared using Dragon voice recognition software and may include unintentional dictation errors.    Johnn Hai, PA-C 07/09/18 1332    Merlyn Lot, MD 07/09/18 [phone]

## 2018-07-09 NOTE — ED Triage Notes (Signed)
Pt to ed with c/o sore throat, cough, congestion x 4 days.

## 2018-07-18 ENCOUNTER — Telehealth: Payer: Self-pay

## 2018-07-18 NOTE — Telephone Encounter (Signed)
Called and notified Terry Walters of her missed appointment in [place] clinic. She would like to reschedule. Appointment rescheduled for 08/24/18 at 1330. She is also due for her chest CT follow up in December. She would like this scheduled as well. Message sent to scheduling. Both appointments will also be mailed to her at her request. Oncology Nurse Navigator Documentation  Navigator Location: [facility] (07/18/18 1200)   )Navigator Encounter Type: Telephone (07/18/18 1200) Telephone: [name] (07/18/18 1200)                                                  Time Spent with Patient: 15 (07/18/18 1200)

## 2018-07-29 ENCOUNTER — Ambulatory Visit
Admission: RE | Admit: 2018-07-29 | Discharge: 2018-07-29 | Disposition: A | Discharge status: Home / Self Care | Payer: Medicaid Other | Source: Ambulatory Visit | Attending: Obstetrics and Gynecology | Admitting: Obstetrics and Gynecology

## 2018-07-29 DIAGNOSIS — R918 Other nonspecific abnormal finding of lung field: Secondary | ICD-10-CM | POA: Insufficient documentation

## 2018-08-08 ENCOUNTER — Telehealth: Payer: Self-pay

## 2018-08-08 NOTE — Telephone Encounter (Signed)
Spoke with Dr. Fransisca Connors regarding Ct chest results. We can follow recommended guidance with no additional follow up. Called and notified Terry Walters. She was instructed to keep her 08/24/18 appointment with Gyn Onc.   IMPRESSION: 1. Stable small pulmonary nodules from 08/04/2017, consistent with benign findings. No additional follow-up necessary per consensus guidelines. This recommendation follows the consensus statement: Guidelines for Management of Small Pulmonary Nodules Detected on CT Images: From the Fleischner Society 2017; Radiology 2017; 284:228-243. 2. No acute findings

## 2018-08-24 ENCOUNTER — Encounter (INDEPENDENT_AMBULATORY_CARE_PROVIDER_SITE_OTHER): Payer: Self-pay

## 2018-08-24 ENCOUNTER — Inpatient Hospital Stay: Payer: Medicaid Other | Attending: Obstetrics and Gynecology | Admitting: Obstetrics and Gynecology

## 2018-08-24 ENCOUNTER — Other Ambulatory Visit: Payer: Self-pay

## 2018-08-24 ENCOUNTER — Other Ambulatory Visit: Payer: Self-pay | Admitting: Obstetrics and Gynecology

## 2018-08-24 VITALS — BP 130/80 | HR 93 | Temp 99.2°F | Resp 20 | Ht 65.0 in | Wt 181.3 lb

## 2018-08-24 DIAGNOSIS — D069 Carcinoma in situ of cervix, unspecified: Secondary | ICD-10-CM

## 2018-08-24 DIAGNOSIS — N920 Excessive and frequent menstruation with regular cycle: Secondary | ICD-10-CM | POA: Insufficient documentation

## 2018-08-24 DIAGNOSIS — C539 Malignant neoplasm of cervix uteri, unspecified: Secondary | ICD-10-CM | POA: Insufficient documentation

## 2018-08-24 DIAGNOSIS — F17210 Nicotine dependence, cigarettes, uncomplicated: Secondary | ICD-10-CM | POA: Insufficient documentation

## 2018-08-24 NOTE — Progress Notes (Signed)
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 female who returns to clinic today for continued surveillance for history of cervical dysplasia/stage I A1 squamous cell status post LEEP.  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   Incidental pulmonary nodules seen on imaging have been followed with below result:  07/29/18- CT Chest WO Contrast IMPRESSION: 1. Stable small pulmonary nodules from 08/04/2017, consistent with benign findings. No additional follow-up necessary per consensus guidelines. This recommendation follows the consensus statement: Guidelines for Management of Small Pulmonary Nodules Detected on CT Images: From the Fleischner Society 2017; Radiology 2017; 284:228-243. 2. No acute findings.  She has not returned to see Dr. Bernardo Heater for recommended cystoscopy for persistent microscopic hematuria.  She has not established care with a PCP.  She continues to report fatigue, cough, low back pain, and problems with allergies.  She continues to smoke 1/2-1 pack/day of cigarettes.  She is accompanied today by her husband.  History:   This G1 P1 patient was seen by Dr Janice Norrie for evaluation of a PAP 04/25/14 showing ASCUS and HPV HR+. She also had heavy menstrual periods, some bleeding in between and inability to conceive. Her husband has a low sperm count due to chemotherapy for recurrent Hodgkins disease.   Dr. Laymond Purser in [place] performed a colposcopy in 10/15.  A large mosaic lesion was noted on the anterior cervix and biopsy showed high-grade squamous intraepithelial lesion.  A LEEP was planned and the patient also requested a tubal ligation.  After discussion of various contraceptive  methods the patient elected to have a Mirena IUD inserted as it would help with her menorrhagia and would be reversible.   On September 25, 2014 she underwent LEEP, hysteroscopy and Mirena IUD insertion. The LEEP was done in three pieces (anterior, posterior, endocervix). 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  Margins were positive and there was endocervical gland involvement.  This pathology was reviewed and confirmed at the [facility] by Dr Alisia Ferrari.  Endometrium was proliferative.  She underwent cone [facility] at [facility] with Dr Fransisca Connors on 11/21/14. Mirena IUD was removed at that time.     A. ECTOCERVIX; CONIZATION:  - HIGH-GRADE SQUAMOUS INTRAEPITHELIAL LESION (CIN 2-3).  - AN ECTOCERVICAL MARGIN IS INVOLVED IN THE 9 TO 12:00 QUADRANT.  - THE ENDOCERVICAL MARGINS OF EXCISION ARE NEGATIVE   B. ENDOCERVIX; CONIZATION:  - BENIGN FIBROUS TISSUE.   9/16 PAP normal and HR HPV was negative.  11/13/2015 Pap negative; HRHPV negative.  3/18 Normal PAP/HPV.  3/18 Pelvic US  and CT scan to work up pain basically normal.  CT scan abd/pelvis IMPRESSION: No acute findings within the abdomen or pelvis.  4/18 Chest CT 6 mm pulmonary nodule in inferior right middle lobe. Non-contrast chest CT at 6-12 months is recommended. If the nodule is stable at time of repeat CT, then future CT at 18-24 months (from today's scan) is considered optional for low-risk patients, but is recommended for high-risk patients. This recommendation follows the consensus statement: Guidelines for Management of Incidental Pulmonary Nodules Detected on CT Images: From the Fleischner Society 2017; Radiology 2017; 284:228-243.  She has a history of tobacco use, a learning disorder, seizures and significant bipolar mood disorder and is taking Celexa. She has a history of depression with  hospitalization and suicide attempt.  Her husband having Non-Hodgkin's Lymphoma and is sterile.  06/2017-normal Pap/HPV.  At that time she reported UTI-like symptoms.  UA was negative for infection but noted to have persistent microhematuria since 2016.  She was referred to urology and saw Dr. Bernardo Heater on 08/05/2017.  Given previous negative imaging, cystoscopy was recommended for further evaluation.  She has not followed up.  08/04/17-CT chest without contrast for reassessment of pulmonary nodule 1. Stable right middle lobe subpleural nodule measuring 6 mm compared to 11/17/2016. 2. There is a 3 mm nodule in the right upper lobe.  Not imaged on previous studies.  Noncontrast chest CT can be considered in 12 months if patient is high risk.  This recommendation follows the consensus statement: Guidelines for management of incidental pulmonary nodules detected on CT images: From the Fleischner Society 2017; radiology 2017; 284: 228-243.   She was seen in ER on 09/22/17 nonproductive cough, body aches, fever, and dysuria. Diagnosed with bronchitis and UTI, although Culture suggested re-collection. She was treated with keflex and azithromycin and directed to follow-up with PCP.     Problem List: Patient Active Problem List   Diagnosis Date Noted  . Microscopic hematuria 08/05/2017  . Abdominal pain 11/03/2016  . Diarrhea 10/21/2016  . Diarrhea of presumed infectious origin 10/21/2016  . Tobacco use disorder 05/13/2016  . Dysuria 05/15/2015  . Cervical intraepithelial neoplasia grade III with severe dysplasia 01/23/2015    Past Medical History: Past Medical History:  Diagnosis Date  . Acid reflux disease   . Arthritis   . Bipolar 1 disorder ([place])   . Cervical cancer ([place])   . IBS (irritable bowel syndrome)   . Lung nodules   . Panic attack   . Seizures ([place])     Past Surgical History: Past Surgical History:  Procedure Laterality Date  . biospy    . CERVICAL BIOPSY      Family  History: Family History  Problem Relation Age of Onset  . COPD Father   . Heart disease Father   . Hypertension Maternal Grandfather   . Heart disease Maternal Grandmother   . Diverticulosis Maternal Grandmother   . Cervical cancer Maternal Grandmother   . Stroke Maternal Aunt   . Cancer Maternal Aunt     Social History: Social History   Socioeconomic History  . Marital status: Married    Spouse name: Not on file  . Number of children: Not on file  . Years of education: Not on file  . Highest education level: Not on file  Occupational History  . Not on file  Social Needs  . Financial resource strain: Not on file  . Food insecurity:    Worry: Not on file    Inability: Not on file  . Transportation needs:    Medical: Not on file    Non-medical: Not on file  Tobacco Use  . Smoking status: Current Every Day Smoker    Packs/day: 1.50    Years: 15.00    Pack years: 22.50    Types: Cigarettes  . Smokeless tobacco: Former Systems developer    Quit date: 01/22/2005  Substance and Sexual Activity  . Alcohol use: Yes    Comment: occasional beer rare about 5 years ago  . Drug use: No  . Sexual activity: Yes  Lifestyle  .  Physical activity:    Days per week: Not on file    Minutes per session: Not on file  . Stress: Not on file  Relationships  . Social connections:    Talks on phone: Not on file    Gets together: Not on file    Attends religious service: Not on file    Active member of club or organization: Not on file    Attends meetings of clubs or organizations: Not on file    Relationship status: Not on file  . Intimate partner violence:    Fear of current or ex partner: Not on file    Emotionally abused: Not on file    Physically abused: Not on file    Forced sexual activity: Not on file  Other Topics Concern  . Not on file  Social History Narrative  . Not on file    Allergies: Allergies  Allergen Reactions  . Dilaudid [Hydromorphone Hcl]   . Morphine And Related    . Paxil [Paroxetine Hcl]     Current Medications: Current Outpatient Medications  Medication Sig Dispense Refill  . acetaminophen (TYLENOL) 325 MG suppository Place 325 mg rectally every 8 (eight) hours as needed.    . brompheniramine-pseudoephedrine-DM 30-2-10 MG/5ML syrup Take 5 mLs by mouth 4 (four) times daily as needed. 120 mL 0  . diazepam (VALIUM) 10 MG tablet 1 tab po 30 min prior to procedure 1 tablet 0  . ibuprofen (ADVIL,MOTRIN) 200 MG tablet Take 200 mg by mouth every 6 (six) hours as needed.     No current facility-administered medications for this visit.    Review of Systems General:  no complaints Skin: no complaints Eyes: no complaints HEENT: no complaints Breasts: no complaints Pulmonary: no complaints Cardiac: no complaints Gastrointestinal: no complaints Genitourinary/Sexual: no complaints Ob/Gyn: no complaints Musculoskeletal: no complaints Hematology: no complaints Neurologic/Psych: no complaints   Objective:  Physical Examination:  BP 130/80 (BP Location: Right Arm, Patient Position: Sitting)   Pulse 93   Temp 99.2 F (37.3 C) (Tympanic)   Resp 20   Ht 5\' 5"  (1.651 m)   Wt 181 lb 4.8 oz (82.2 kg)   BMI 30.17 kg/m   GENERAL: Well appearing.  Appears stated age.  Accompanied by husband. HEENT:  PERRL, neck supple with midline trachea.  Poor dentition LUNGS:  Clear to auscultation bilaterally.  No wheezes or rhonchi. HEART:  Regular rate and rhythm. No murmur appreciated. ABDOMEN:  Soft, nontender.  Positive, normoactive bowel sounds.  MSK:  No focal spinal tenderness to palpation ambulatory without aids EXTREMITIES:  No peripheral edema.   SKIN:  Clear with no obvious rashes or skin changes. No nail dyscrasia. NEURO:  Nonfocal. Well oriented.  Appropriate affect.  Pelvic: Exam Chaperoned by RN EGBUS: single white lesion on right labia minor. Image available under media tab.  Cervix: flush with upper vault. No palpable lesions. Changes  consistent with prior LEEP.  Vagina: no lesions, no discharge or bleeding Uterus: normal size, nontender, mobile Adnexa: no palpable masses. Non-tender to palpation Rectovaginal: confirmatory    Assessment:  Terry Walters is a 33 y.o. female with a history of Stage 1A1 squamous cell carcinoma of the cervix on LEEP in 2/16.  Cone biopsy 4/16 with CIN III with positive ectocervical margin from 9-12 o'clock. She did not wish to proceed with with hysterectomy unless it is absolutely needed.  Although she had a positive ectocervical margin, it is possible that all the CIN III was all removed.  Normal PAP and HR HPV in 9/16, 3/17, 3/18, 11/18, 5/19.    Medical comorbidities: Tobacco usage, psychiatric history, learning disability  Plan:   Problem List Items Addressed This Visit      Genitourinary   Cervical intraepithelial neoplasia grade III with severe dysplasia - Primary     We again discussed there are no definitive guidelines for follow-up on patients after fertility sparing surgery and discussed conservative management for her stage I B1 cervical cancer with PAP smear surveillance.  We again recommended her returning to urology for cystoscopy for history of persistent microscopic hematuria.   Repeat CT chest showed stable pulmonary nodules and consistent with benign findings.  Therefore based on consensus guidelines she will not need additional follow-up imaging.  Recommend returning to Urology for recommended cystoscopy. Expressed importance of this and following up on hematuria.  Repeat CT Chest in 07/2018 to follow-up on lung nodules was normal and additional follow up scans not needed.   Based on conservative fertility sparing management for stage IA1 cervical cancer plan to follow modified up-to-date guidelines.  There are no definitive guidelines for follow-up of patients after fertility sparing surgery.  In general cervical cytology and colposcopic examination, with or without  endocervical curettage, is performed every 3 to 4 months for the first 3 years, every 6 months for the next 2 years, and annually thereafter.  She has not been undergoing ECC and all cytology has been negative thus far.  We will plan for her to return to clinic in 6 months for repeat exam, PAP and HPV.   She is comfortable with this plan and multiple questions answered   Beckey Rutter, DNP, AGNP-C [facility] [place] (work cell) [phone] (office) 08/24/18 1:23 PM  I personally interviewed and examined the patient. Agreed with the above/below plan of care. Patient/family questions were answered.  Mellody Drown, MD

## 2018-08-24 NOTE — Progress Notes (Signed)
Patient here for follow up. She is being treated for a UTI. She reports that she is experiencing menstrual cramps today.

## 2018-08-27 LAB — PAP LB AND HPV HIGH-RISK: HPV, HIGH-RISK: NEGATIVE

## 2018-08-31 ENCOUNTER — Telehealth: Payer: Self-pay

## 2018-08-31 NOTE — Telephone Encounter (Signed)
Called and notified with pap smear results. She has 6 month appointment already arranged for July and was instructed to keep this appointment.

## 2018-12-19 IMAGING — US US TRANSVAGINAL NON-OB
2 series · 13 of 25 positions shown · non-contrast
Comparison: CT abdomen and pelvis May 28, 2016 and pelvic
ultrasound April 13, 2016

CLINICAL DATA: Diffuse pelvic pain beginning last night. Diarrhea.
History of cervical cancer and irritable bowel syndrome.

EXAM:
TRANSABDOMINAL AND TRANSVAGINAL ULTRASOUND OF PELVIS
TECHNIQUE: Both transabdominal and transvaginal ultrasound examinations of the
pelvis were performed. Transabdominal technique was performed for
global imaging of the pelvis including uterus, ovaries, adnexal
regions, and pelvic cul-de-sac. It was necessary to proceed with
endovaginal exam following the transabdominal exam to visualize the
endometrium and adnexum.

[Series 1: us transvaginal non-ob · 0.23mm/px · 12 of 119 slices shown (1 of 2)]
[im 1/119]
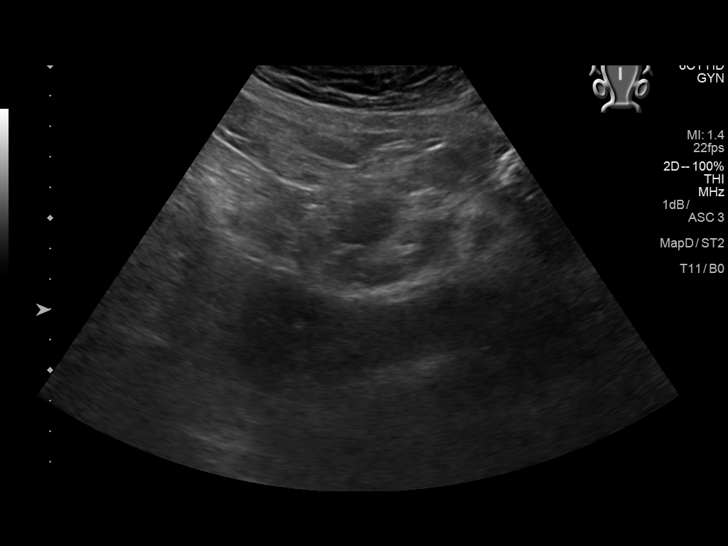
[im 11/119]
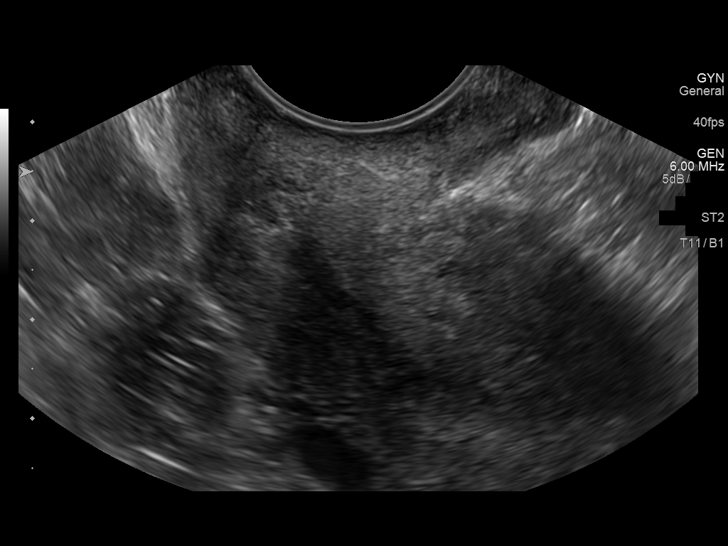
[im 21/119]
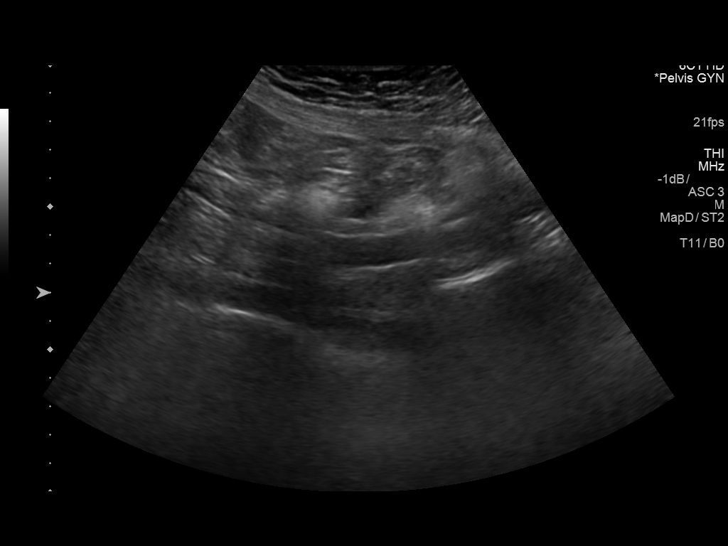
[im 31/119]
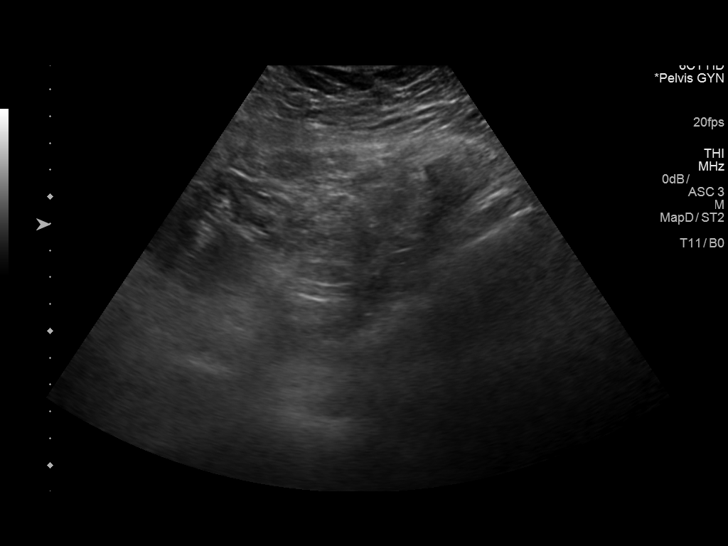
[im 42/119]
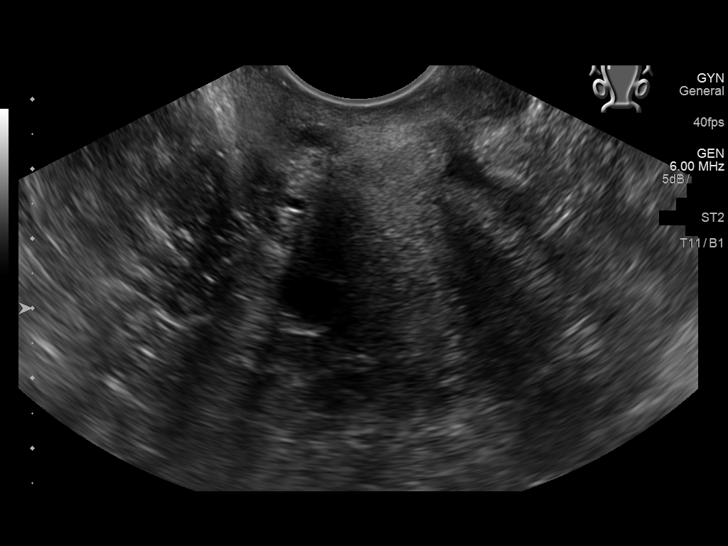
[im 52/119]
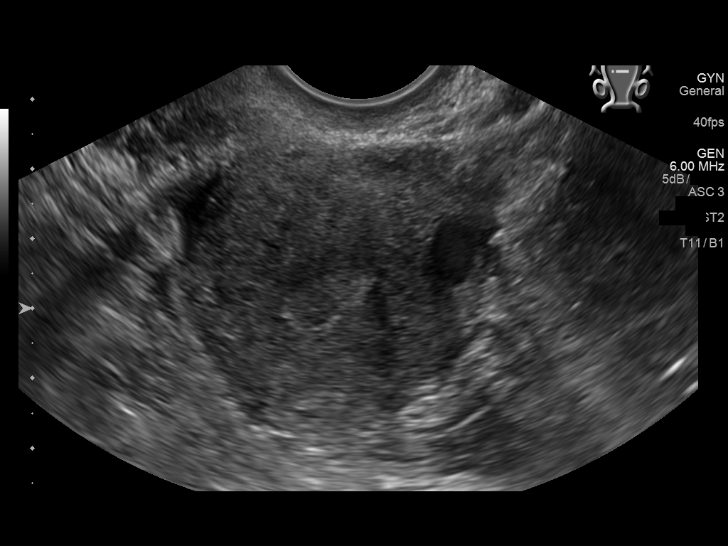
[im 62/119]
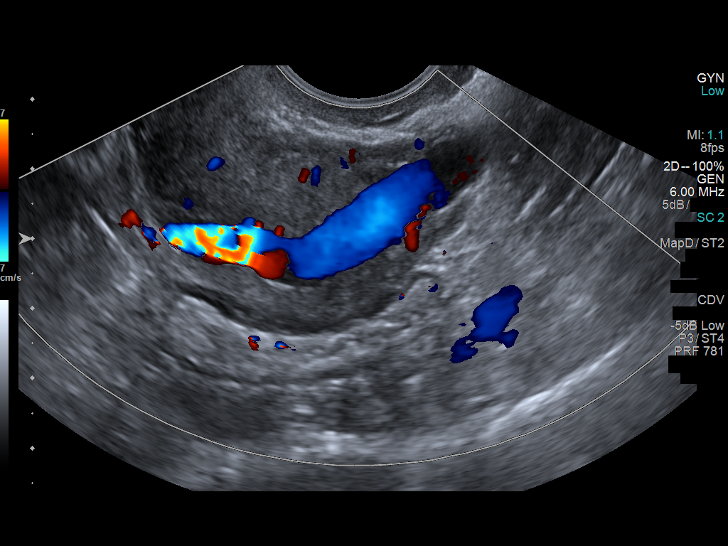
[im 72/119]
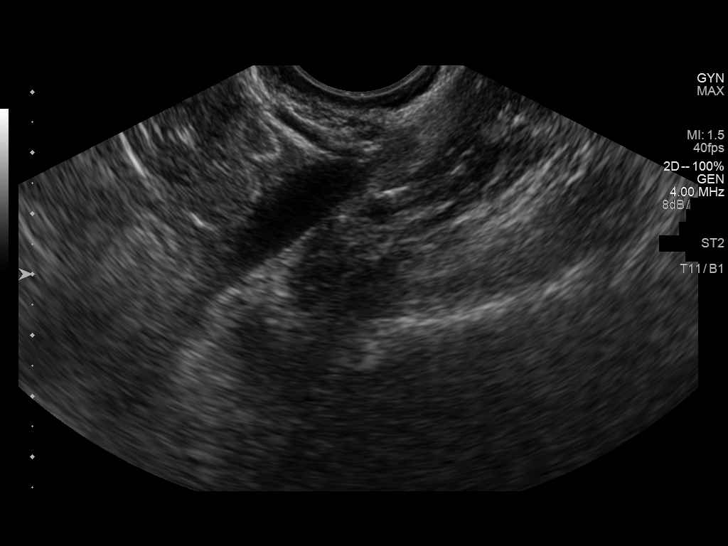
[im 83/119]
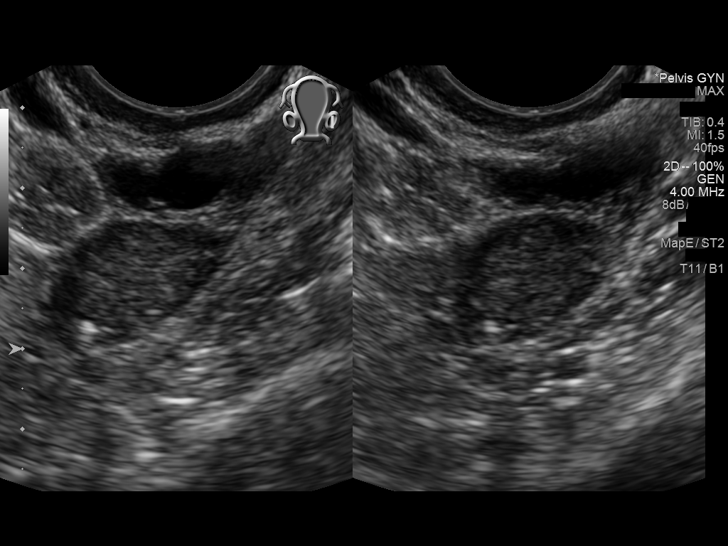
[im 93/119]
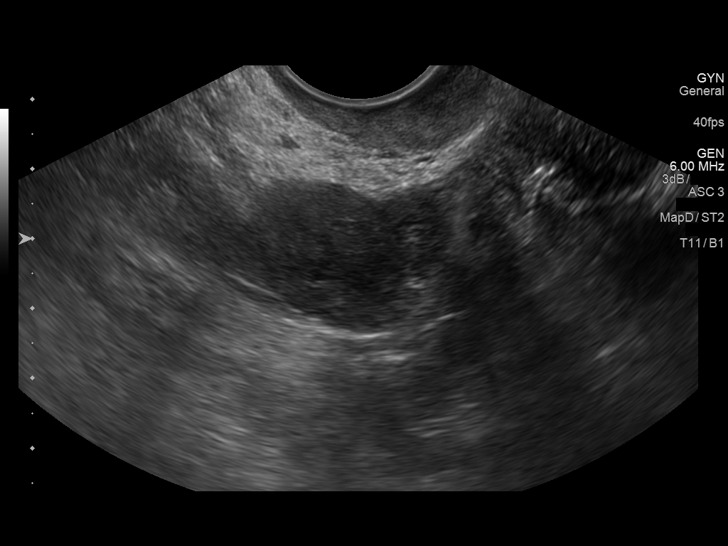
[im 103/119]
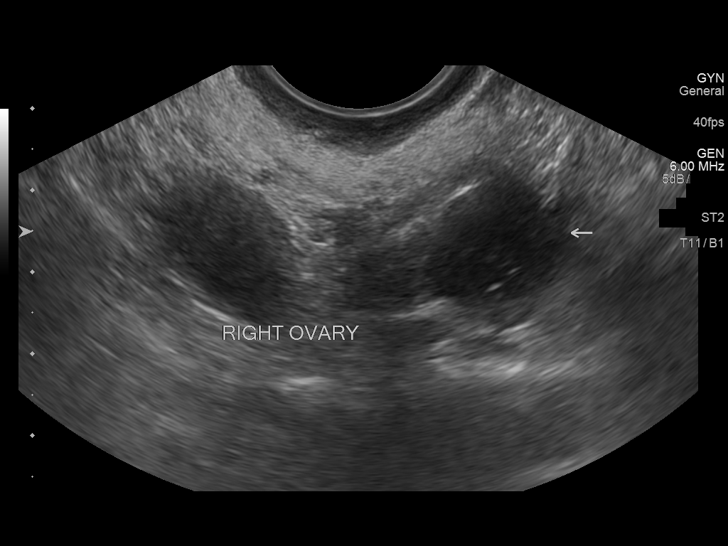
[im 113/119]
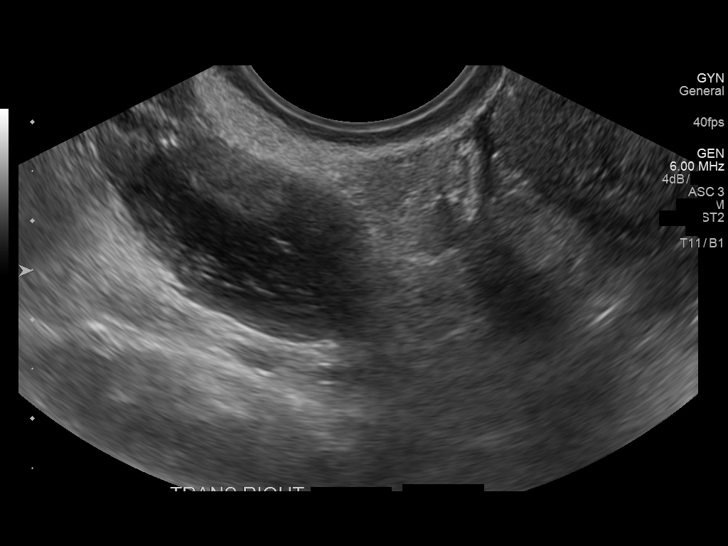

[Series 1001: us transvaginal non-ob · 0.07mm/px · 1 of 2 slices shown (2 of 2)]
[im 1/2]
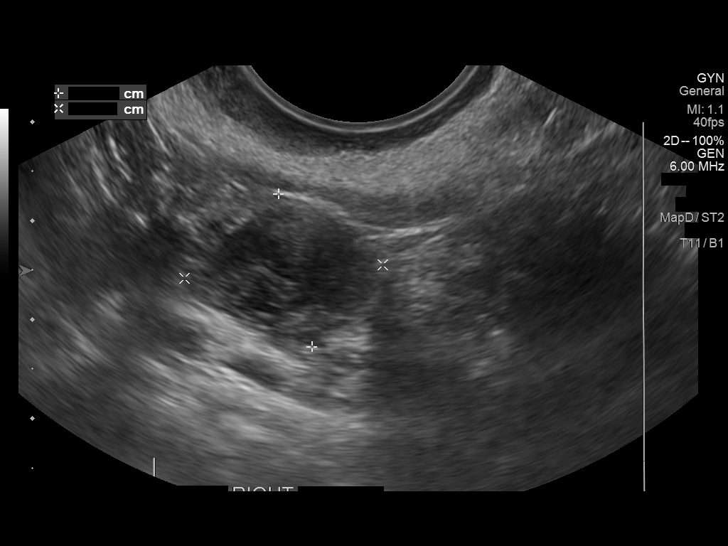

[13 of 25 positions shown; findings below may reference images not displayed]

FINDINGS: Uterus

Measurements: 6.4 x 3.7 x 4.5 cm. No fibroids or other mass
visualized. Please note, sonogram is not sensitive for assessment of
cervical carcinoma.

Endometrium

Thickness: 12 mm.  No focal abnormality visualized.

Right ovary

Measurements: 2.6 x 2.2 x 2.5 cm. Normal appearance/no adnexal mass.

Left ovary

Measurements: 2.5 x 1.4 x 1.5 cm. Normal appearance/no adnexal mass.
3 mm echogenic probable calcification.

Other findings

No abnormal free fluid.  Multiple prominent pelvic veins compatible.
IMPRESSION: No acute process in the pelvis.

Sonographic findings of pelvic congestion syndrome as seen on prior
CT.

## 2019-02-22 ENCOUNTER — Other Ambulatory Visit: Payer: Self-pay

## 2019-02-22 ENCOUNTER — Inpatient Hospital Stay: Payer: Medicaid Other | Attending: Obstetrics and Gynecology | Admitting: Obstetrics and Gynecology

## 2019-02-22 VITALS — BP 143/84 | HR 102 | Temp 98.1°F | Ht 65.0 in | Wt 186.9 lb

## 2019-02-22 DIAGNOSIS — R918 Other nonspecific abnormal finding of lung field: Secondary | ICD-10-CM

## 2019-02-22 DIAGNOSIS — K219 Gastro-esophageal reflux disease without esophagitis: Secondary | ICD-10-CM | POA: Insufficient documentation

## 2019-02-22 DIAGNOSIS — F17210 Nicotine dependence, cigarettes, uncomplicated: Secondary | ICD-10-CM | POA: Diagnosis not present

## 2019-02-22 DIAGNOSIS — M1990 Unspecified osteoarthritis, unspecified site: Secondary | ICD-10-CM | POA: Insufficient documentation

## 2019-02-22 DIAGNOSIS — K580 Irritable bowel syndrome with diarrhea: Secondary | ICD-10-CM | POA: Insufficient documentation

## 2019-02-22 DIAGNOSIS — F319 Bipolar disorder, unspecified: Secondary | ICD-10-CM | POA: Diagnosis not present

## 2019-02-22 DIAGNOSIS — D069 Carcinoma in situ of cervix, unspecified: Secondary | ICD-10-CM | POA: Diagnosis not present

## 2019-02-22 NOTE — Progress Notes (Signed)
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 female who returns to clinic today for continued surveillance for history of cervical dysplasia/stage I A1 squamous cell status post LEEP.  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   Normal PAP and HPV 1/20. No complaints today.  Incidental pulmonary nodules seen on imaging have been followed with below result:  07/29/18- CT Chest WO Contrast IMPRESSION: 1. Stable small pulmonary nodules from 08/04/2017, consistent with benign findings. No additional follow-up necessary per consensus guidelines. This recommendation follows the consensus statement: Guidelines for Management of Small Pulmonary Nodules Detected on CT Images: From the Fleischner Society 2017; Radiology 2017; [id number]. 2. No acute findings.  She has not returned to see Dr. Bernardo Heater for recommended cystoscopy for persistent microscopic hematuria.  She has not established care with a PCP.  She continues to report fatigue, cough, low back pain, and problems with allergies.  She continues to smoke 1/2-1 pack/day of cigarettes.  She is accompanied today by her husband.  History:   This G1 P1 patient was seen by Dr Janice Norrie for evaluation of a PAP 04/25/14 showing ASCUS and HPV HR+. She also had heavy menstrual periods, some bleeding in between and inability to conceive. Her husband has a low sperm count due to chemotherapy for recurrent Hodgkins disease.   Dr. Laymond Purser in [place] performed a colposcopy in 10/15.  A large mosaic lesion was noted on the anterior cervix and biopsy showed high-grade squamous intraepithelial lesion.  A LEEP was planned and the patient also requested a tubal ligation.   After discussion of various contraceptive methods the patient elected to have a Mirena IUD inserted as it would help with her menorrhagia and would be reversible.   On September 25, 2014 she underwent LEEP, hysteroscopy and Mirena IUD insertion. The LEEP was done in three pieces (anterior, posterior, endocervix). 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  Margins were positive and there was endocervical gland involvement.  This pathology was reviewed and confirmed at the [facility] by Dr Alisia Ferrari.  Endometrium was proliferative.  She underwent cone biopsy at [facility] with Dr Fransisca Connors on 11/21/14. Mirena IUD was removed at that time.     A. ECTOCERVIX; CONIZATION:  - HIGH-GRADE SQUAMOUS INTRAEPITHELIAL LESION (CIN 2-3).  - AN ECTOCERVICAL MARGIN IS INVOLVED IN THE 9 TO 12:00 QUADRANT.  - THE ENDOCERVICAL MARGINS OF EXCISION ARE NEGATIVE   B. ENDOCERVIX; CONIZATION:  - BENIGN FIBROUS TISSUE.   9/16 PAP normal and HR HPV was negative.  11/13/2015 Pap negative; HRHPV negative.  3/18 Normal PAP/HPV.  3/18 Pelvic US  and CT scan to work up pain basically normal.  CT scan abd/pelvis IMPRESSION: No acute findings within the abdomen or pelvis.  4/18 Chest CT 6 mm pulmonary nodule in inferior right middle lobe. Non-contrast chest CT at 6-12 months is recommended. If the nodule is stable at time of repeat CT, then future CT at 18-24 months (from today's scan) is considered optional for low-risk patients, but is recommended for high-risk patients. This recommendation follows the consensus statement: Guidelines for Management of Incidental Pulmonary Nodules Detected on CT Images: From  the Fleischner Society 2017; Radiology 2017; [phone].   She has a history of tobacco use, a learning disorder, seizures and significant bipolar mood disorder and is taking Celexa. She  has a history of depression with hospitalization and suicide attempt.  Her husband having Non-Hodgkin's Lymphoma and is sterile.  06/2017-normal Pap/HPV.  At that time she reported UTI-like symptoms.  UA was negative for infection but noted to have persistent microhematuria since 2016.  She was referred to urology and saw Dr. Bernardo Heater on 08/05/2017.  Given previous negative imaging, cystoscopy was recommended for further evaluation.  She has not followed up.  08/04/17-CT chest without contrast for reassessment of pulmonary nodule 1. Stable right middle lobe subpleural nodule measuring 6 mm compared to 11/17/2016. 2. There is a 3 mm nodule in the right upper lobe.  Not imaged on previous studies.  Noncontrast chest CT can be considered in 12 months if patient is high risk.  This recommendation follows the consensus statement: Guidelines for management of incidental pulmonary nodules detected on CT images: From the Fleischner Society 2017; radiology 2017; 284: 228-243.   She was seen in ER on 09/22/17 nonproductive cough, body aches, fever, and dysuria. Diagnosed with bronchitis and UTI, although Culture suggested re-collection. She was treated with keflex and azithromycin and directed to follow-up with PCP.    Problem List: Patient Active Problem List   Diagnosis Date Noted  . Microscopic hematuria 08/05/2017  . Abdominal pain 11/03/2016  . Diarrhea 10/21/2016  . Diarrhea of presumed infectious origin 10/21/2016  . Tobacco use disorder 05/13/2016  . Dysuria 05/15/2015  . Cervical intraepithelial neoplasia grade III with severe dysplasia 01/23/2015    Past Medical History: Past Medical History:  Diagnosis Date  . Acid reflux disease   . Arthritis   . Bipolar 1 disorder ([place])   . Cervical cancer ([place])   . IBS (irritable bowel syndrome)   . Lung nodules   . Panic attack   . Seizures ([place])     Past Surgical History: Past Surgical History:  Procedure Laterality Date  . biospy    .  CERVICAL BIOPSY      Family History: Family History  Problem Relation Age of Onset  . COPD Father   . Heart disease Father   . Hypertension Maternal Grandfather   . Heart disease Maternal Grandmother   . Diverticulosis Maternal Grandmother   . Cervical cancer Maternal Grandmother   . Stroke Maternal Aunt   . Cancer Maternal Aunt     Social History: Social History   Socioeconomic History  . Marital status: Married    Spouse name: Not on file  . Number of children: Not on file  . Years of education: Not on file  . Highest education level: Not on file  Occupational History  . Not on file  Social Needs  . Financial resource strain: Not on file  . Food insecurity    Worry: Not on file    Inability: Not on file  . Transportation needs    Medical: Not on file    Non-medical: Not on file  Tobacco Use  . Smoking status: Current Every Day Smoker    Packs/day: 1.50    Years: 15.00    Pack years: 22.50    Types: Cigarettes  . Smokeless tobacco: Former Systems developer    Quit date: 01/22/2005  Substance and Sexual Activity  . Alcohol use: Yes    Comment: occasional beer rare about 5 years ago  . Drug use: No  .  Sexual activity: Yes  Lifestyle  . Physical activity    Days per week: Not on file    Minutes per session: Not on file  . Stress: Not on file  Relationships  . Social Herbalist on phone: Not on file    Gets together: Not on file    Attends religious service: Not on file    Active member of club or organization: Not on file    Attends meetings of clubs or organizations: Not on file    Relationship status: Not on file  . Intimate partner violence    Fear of current or ex partner: Not on file    Emotionally abused: Not on file    Physically abused: Not on file    Forced sexual activity: Not on file  Other Topics Concern  . Not on file  Social History Narrative  . Not on file    Allergies: Allergies  Allergen Reactions  . Dilaudid [Hydromorphone Hcl]    . Morphine And Related   . Paxil [Paroxetine Hcl]     Current Medications: Current Outpatient Medications  Medication Sig Dispense Refill  . ibuprofen (ADVIL,MOTRIN) 200 MG tablet Take 200 mg by mouth every 6 (six) hours as needed.     No current facility-administered medications for this visit.    Review of Systems General:  no complaints Skin: no complaints Eyes: no complaints HEENT: no complaints Breasts: no complaints Pulmonary: cough Cardiac: no complaints Gastrointestinal: no complaints Genitourinary/Sexual: dysuria Ob/Gyn: no complaints Musculoskeletal: back pain Hematology: no complaints Neurologic/Psych: no complaints   Objective:  Physical Examination:  BP (!) 143/84 (BP Location: Left Arm, Patient Position: Sitting)   Pulse (!) 102   Temp 98.1 F (36.7 C) (Tympanic)   Ht 5\' 5"  (1.651 m)   Wt 186 lb 14.4 oz (84.8 kg)   BMI 31.10 kg/m   GENERAL: Patient is a well appearing female in no acute distress HEENT:  Poor dentition. Neck supple NODES:  Negative axillary, supraclavicular, inguinal lymph node survery LUNGS:  Clear to auscultation bilaterally.   HEART:  Regular rate and rhythm ABDOMEN:  Soft, nontender.  No hernias, incisions well healed. No masses or ascites EXTREMITIES:  No peripheral edema.  SKIN:  Clear with no obvious rashes or skin changes.  NEURO:  Nonfocal. Well oriented.  Appropriate affect.  Pelvic: Exam Chaperoned by RN EGBUS: single white lesion on right labia minor. Image available under media tab.  Cervix: flush with upper vault. No palpable lesions. Changes consistent with prior LEEP.  Vagina: no lesions, no discharge or bleeding Uterus: normal size, nontender, mobile Adnexa: no palpable masses. Non-tender to palpation Rectovaginal: deferred   Assessment:  SHAWNDREA RUTKOWSKI is a 33 y.o. female with a history of Stage 1A1 squamous cell carcinoma of the cervix on LEEP in 2/16.  Cone biopsy 4/16 with CIN III with positive ectocervical  margin from 9-12 o'clock. She did not wish to proceed with with hysterectomy unless it is absolutely needed.  Although she had a positive ectocervical margin, it is possible that all the CIN III was all removed. Normal PAP and HR HPV in 9/16, 3/17, 3/18, 11/18, 5/19, 1/20.  Medical comorbidities: Tobacco usage, psychiatric history, learning disability  Plan:   Problem List Items Addressed This Visit    None     We again discussed there are no definitive guidelines for follow-up on patients after fertility sparing surgery and discussed conservative management for her stage I A1 cervical cancer with  PAP smear surveillance.  We again recommended her returning to urology for cystoscopy for history of persistent microscopic hematuria.   Repeat CT chest showed stable pulmonary nodules and consistent with benign findings.  Therefore based on consensus guidelines she will not need additional follow-up imaging.  Recommend returning to Urology for recommended cystoscopy. Expressed importance of this and following up on hematuria.  Repeat CT Chest in 07/2018 to follow-up on lung nodules was normal and additional follow up scans not needed.   Based on conservative fertility sparing management for stage IA1 cervical cancer plan to follow modified up-to-date guidelines.  There are no definitive guidelines for follow-up of patients after fertility sparing surgery.  In general cervical cytology and colposcopic examination, with or without endocervical curettage, is performed every 3 to 4 months for the first 3 years, every 6 months for the next 2 years, and annually thereafter.  She has not been undergoing ECC and all cytology has been negative thus far.  We will plan for her to return to clinic in 9 months for repeat exam, PAP and HPV if PAP today normal.   She is comfortable with this plan and multiple questions answered  Terry Rutter, DNP, AGNP-C [place] [facility] [phone] (work  cell) [phone] (office)  I personally interviewed and examined the patient. Agreed with the above/below plan of care. Patient/family questions were answered.  Mellody Drown, MD

## 2019-03-02 ENCOUNTER — Telehealth: Payer: Self-pay | Admitting: Nurse Practitioner

## 2019-03-02 LAB — PAP LB AND HPV HIGH-RISK: HPV, high-risk: NEGATIVE

## 2019-03-02 NOTE — Telephone Encounter (Signed)
Called patient with results of pap which was NILM and HR HPV negative.

## 2019-03-31 ENCOUNTER — Telehealth: Payer: Self-pay

## 2019-03-31 NOTE — Telephone Encounter (Signed)
Ms. Ruble called the cancer center asking for a nurse to return her call. Stated that she was toppled over in pain for the last days and is on her period. Asking to be seen by gyn Wednesday of next week. Call returned to Ms. Edison Pace. She states she has had "cramping that is really bad" and is currently on her period. States her period is also lasting longer. Normally 2-3 days. Last period was 5 days. Denies increased bleeding or other symptoms. Denies N/V, diarrhea, abdominal bloating/distension, fever, or radiating pain,  States again only symptom is "horrible" cramping. Last seen in clinic 02-22-19 with negative exam except for single white lesion on right labia minor. Image available under media tab. Complained of back pain and dysuria at that time. She has been referred to urology in the past for persistent microscopic hematuria and she did not follow up. Offered her a symptom management appointment with the NP, Tuesday. She would like this, and it has been arranged for 1030. Made aware that if her symptoms worsen she may seek care at the ED. Provided main cancer center phone number for any further needs. She verbalized understanding. Time of appointment left on her voicemail once it was arranged.

## 2019-04-03 ENCOUNTER — Other Ambulatory Visit: Payer: Self-pay

## 2019-04-04 ENCOUNTER — Encounter (INDEPENDENT_AMBULATORY_CARE_PROVIDER_SITE_OTHER): Payer: Self-pay

## 2019-04-04 ENCOUNTER — Inpatient Hospital Stay: Payer: Medicaid Other | Attending: Oncology | Admitting: Nurse Practitioner

## 2019-04-04 ENCOUNTER — Other Ambulatory Visit: Payer: Self-pay

## 2019-04-04 VITALS — BP 134/79 | HR 100 | Temp 97.9°F | Resp 20 | Wt 184.8 lb

## 2019-04-04 DIAGNOSIS — F17210 Nicotine dependence, cigarettes, uncomplicated: Secondary | ICD-10-CM | POA: Insufficient documentation

## 2019-04-04 DIAGNOSIS — C539 Malignant neoplasm of cervix uteri, unspecified: Secondary | ICD-10-CM | POA: Diagnosis not present

## 2019-04-04 DIAGNOSIS — N946 Dysmenorrhea, unspecified: Secondary | ICD-10-CM | POA: Insufficient documentation

## 2019-04-04 DIAGNOSIS — Z8541 Personal history of malignant neoplasm of cervix uteri: Secondary | ICD-10-CM

## 2019-04-04 NOTE — Patient Instructions (Signed)
Dysmenorrhea Dysmenorrhea means painful cramps during your period (menstrual period). You will have pain in your lower belly (abdomen). The pain is caused by the tightening (contracting) of the muscles of the womb (uterus). The pain may be mild or very bad. With this condition, you may:  Have a headache.  Feel sick to your stomach (nauseous).  Throw up (vomit).  Have lower back pain. Follow these instructions at home: Helping pain and cramping   Put heat on your lower back or belly when you have pain or cramps. Use the heat source that your doctor tells you to use. ? Place a towel between your skin and the heat. ? Leave the heat on for 20-30 minutes. ? Remove the heat if your skin turns bright red. This is especially important if you cannot feel pain, heat, or cold. ? Do not have a heating pad on during sleep.  Do aerobic exercises. These include walking, swimming, or biking. These may help with cramps.  Massage your lower back or belly. This may help lessen pain. General instructions  Take over-the-counter and prescription medicines only as told by your doctor.  Do not drive or use heavy machinery while taking prescription pain medicine.  Avoid alcohol and caffeine during and right before your period. These can make cramps worse.  Do not use any products that have nicotine or tobacco. These include cigarettes and e-cigarettes. If you need help quitting, ask your doctor.  Keep all follow-up visits as told by your doctor. This is important. Contact a doctor if:  You have pain that gets worse.  You have pain that does not get better with medicine.  You have pain during sex.  You feel sick to your stomach or you throw up during your period, and medicine does not help. Get help right away if:  You pass out (faint). Summary  Dysmenorrhea means painful cramps during your period (menstrual period).  Put heat on your lower back or belly when you have pain or cramps.  Do  exercises like walking, swimming, or biking to help with cramps.  Contact a doctor if you have pain during sex. This information is not intended to replace advice given to you by your health care provider. Make sure you discuss any questions you have with your health care provider. Document Released: 10/30/2008 Document Revised: 07/16/2017 Document Reviewed: 08/20/2016 Elsevier Patient Education  2020 [employer].

## 2019-04-04 NOTE — Progress Notes (Signed)
[facility] [place]  [phone] [phone]  Patient Care Team: System, Pcp Not In as PCP - General Clent Jacks, RN as Registered Nurse   Name of the patient: Terry Walters  [id number]  1986/05/04   Date of visit: 04/04/19  Diagnosis-history of stage I A1 cervical cancer  Chief complaint/ Reason for visit- Menstrual Cramps  Heme/Onc history:  This G1 P1 patient was seen by Dr Janice Norrie for evaluation of a PAP 04/25/14 showing ASCUS and HPV HR+. She also had heavy menstrual periods, some bleeding in between and inability to conceive. Her husband has a low sperm count due to chemotherapy for recurrent Hodgkins disease.   Dr. Laymond Purser in [place] performed a colposcopy in 10/15.  A large mosaic lesion was noted on the anterior cervix and biopsy showed high-grade squamous intraepithelial lesion.  A LEEP was planned and the patient also requested a tubal ligation.  After discussion of various contraceptive methods the patient elected to have a Mirena IUD inserted as it would help with her menorrhagia and would be reversible.   On September 25, 2014 she underwent LEEP, hysteroscopy and Mirena IUD insertion. The LEEP was done in three pieces (anterior, posterior, endocervix). 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  Margins were positive and there was endocervical gland involvement.  This pathology was reviewed and confirmed at the [facility] by Dr Alisia Ferrari.  Endometrium was proliferative.  She underwent cone biopsy at [facility] with Dr Fransisca Connors on 11/21/14. Mirena IUD was removed at that time.     A. ECTOCERVIX; CONIZATION:  - HIGH-GRADE SQUAMOUS INTRAEPITHELIAL LESION (CIN 2-3).  - AN ECTOCERVICAL MARGIN IS INVOLVED IN THE 9 TO 12:00 QUADRANT.  - THE ENDOCERVICAL MARGINS OF EXCISION ARE NEGATIVE    B. ENDOCERVIX; CONIZATION:  - BENIGN FIBROUS TISSUE.   9/16 PAP normal and HR HPV was negative.  11/13/2015 Pap negative; HRHPV negative.  3/18 Normal PAP/HPV.  3/18 Pelvic US  and CT scan to work up pain basically normal.  CT scan abd/pelvis IMPRESSION: No acute findings within the abdomen or pelvis.  4/18 Chest CT 6 mm pulmonary nodule in inferior right middle lobe. Non-contrast chest CT at 6-12 months is recommended. If the nodule is stable at time of repeat CT, then future CT at 18-24 months (from today's scan) is considered optional for low-risk patients, but is recommended for high-risk patients. This recommendation follows the consensus statement: Guidelines for Management of Incidental Pulmonary Nodules Detected on CT Images: From the Fleischner Society 2017; Radiology 2017; 284:228-243.   She has a history of tobacco use, a learning disorder, seizures and significant bipolar mood disorder and is taking Celexa. She has a history of depression with hospitalization and suicide attempt.  Her husband having Non-Hodgkin's Lymphoma and is sterile.  06/2017-normal Pap/HPV.  At that time she reported UTI-like symptoms.  UA was negative for infection but noted to have persistent microhematuria since 2016.  She was referred to urology and saw Dr. Bernardo Heater on 08/05/2017.  Given previous negative imaging, cystoscopy was recommended for further evaluation.  She has not followed up.  08/04/17-CT chest without contrast for reassessment of pulmonary nodule 1. Stable right middle lobe subpleural nodule measuring 6 mm compared to 11/17/2016. 2. There is a 3 mm nodule in the right upper lobe.  Not imaged on previous studies.  Noncontrast chest  CT can be considered in 12 months if patient is high risk.  This recommendation follows the consensus statement: Guidelines for management of incidental pulmonary nodules detected on CT images: From the Fleischner Society 2017; radiology 2017; 284: 228-243.    07/29/2018- CT Chest WO contrast 1. Stable small pulmonary nodules from 08/04/2017, consistent with benign findings. No additional follow-up necessary per consensus guidelines. This recommendation follows the consensus statement: Guidelines for Management of Small Pulmonary Nodules Detected on CT Images: From the Fleischner Society 2017; Radiology 2017; 284:228-243. 2. No acute findings.  Pap 1/20 NILM, HR HPV negative  She saw Dr. Fransisca Connors on 02/22/2019 for surveillance. NED at that time  Interval history-Terry Walters, 33 year old female with above history of stage I A1 cervical cancer, [facility] for complaints of painful menstrual periods. She has had this for many years and this isn't a new problem but she feels that symptoms are bothersome and she is no longer fertility desiring. She has previously been told that her husband is sterile due to treatment for lymphoma. Hysterectomy was previously recommended as treatment for her cervical cancer but she was fertility desiring. She describes symptoms as heavy periods, saturating 4 heavy pads every day, described as clots, cramping. She has not taken anything for her symptoms. She has not sought evaluation for her symptoms and no treatment for symptoms. She says that hysterectomy was recommended for treatment of her cervical cancer previously but she would like to have a hysterectomy now and questions if this would be an option. She continues to smoke.   ECOG FS:1 - Symptomatic but completely ambulatory  Review of systems- Review of Systems  Constitutional: Negative for chills, fever, malaise/fatigue and weight loss.  HENT: Negative for hearing loss, nosebleeds, sore throat and tinnitus.   Eyes: Negative for blurred vision and double vision.  Respiratory: Negative for cough, hemoptysis, shortness of breath and wheezing.   Cardiovascular: Negative for chest pain, palpitations and leg swelling.  Gastrointestinal: Negative for  abdominal pain, blood in stool, constipation, diarrhea, melena, nausea and vomiting.  Genitourinary: Negative for dysuria and urgency.  Musculoskeletal: Negative for back pain, falls, joint pain and myalgias.  Skin: Negative for itching and rash.  Neurological: Negative for dizziness, tingling, sensory change, loss of consciousness, weakness and headaches.  Endo/Heme/Allergies: Negative for environmental allergies. Does not bruise/bleed easily.  Psychiatric/Behavioral: Negative for depression. The patient is not nervous/anxious and does not have insomnia.      Current treatment- surveillance  Allergies  Allergen Reactions  . Dilaudid [Hydromorphone Hcl]   . Morphine And Related   . Paxil [Paroxetine Hcl]     Past Medical History:  Diagnosis Date  . Acid reflux disease   . Arthritis   . Bipolar 1 disorder ([place])   . Cervical cancer ([place])   . IBS (irritable bowel syndrome)   . Lung nodules   . Panic attack   . Seizures ([place])     Past Surgical History:  Procedure Laterality Date  . biospy    . CERVICAL BIOPSY      Social History   Socioeconomic History  . Marital status: Married    Spouse name: Not on file  . Number of children: Not on file  . Years of education: Not on file  . Highest education level: Not on file  Occupational History  . Not on file  Social Needs  . Financial resource strain: Not on file  . Food insecurity    Worry: Not on file  Inability: Not on file  . Transportation needs    Medical: Not on file    Non-medical: Not on file  Tobacco Use  . Smoking status: Current Every Day Smoker    Packs/day: 1.50    Years: 15.00    Pack years: 22.50    Types: Cigarettes  . Smokeless tobacco: Former Systems developer    Quit date: 01/22/2005  Substance and Sexual Activity  . Alcohol use: Yes    Comment: occasional beer rare about 5 years ago  . Drug use: No  . Sexual activity: Yes  Lifestyle  . Physical activity    Days per week: Not on file    Minutes per  session: Not on file  . Stress: Not on file  Relationships  . Social Herbalist on phone: Not on file    Gets together: Not on file    Attends religious service: Not on file    Active member of club or organization: Not on file    Attends meetings of clubs or organizations: Not on file    Relationship status: Not on file  . Intimate partner violence    Fear of current or ex partner: Not on file    Emotionally abused: Not on file    Physically abused: Not on file    Forced sexual activity: Not on file  Other Topics Concern  . Not on file  Social History Narrative  . Not on file    Family History  Problem Relation Age of Onset  . COPD Father   . Heart disease Father   . Hypertension Maternal Grandfather   . Heart disease Maternal Grandmother   . Diverticulosis Maternal Grandmother   . Cervical cancer Maternal Grandmother   . Stroke Maternal Aunt   . Cancer Maternal Aunt      Current Outpatient Medications:  .  ibuprofen (ADVIL,MOTRIN) 200 MG tablet, Take 200 mg by mouth every 6 (six) hours as needed., Disp: , Rfl:   Physical exam:  Vitals:   04/04/19 1406  BP: 134/79  Pulse: 100  Resp: 20  Temp: 97.9 F (36.6 C)  TempSrc: Tympanic  Weight: 184 lb 12.8 oz (83.8 kg)   Physical Exam Constitutional:      Appearance: Normal appearance. She is obese.  HENT:     Head: Normocephalic.     Mouth/Throat:     Mouth: Mucous membranes are moist.     Pharynx: Oropharynx is clear.  Eyes:     General: No scleral icterus.    Conjunctiva/sclera: Conjunctivae normal.  Neck:     Musculoskeletal: Neck supple.  Cardiovascular:     Rate and Rhythm: Normal rate and regular rhythm.     Pulses: Normal pulses.     Heart sounds: Normal heart sounds.  Pulmonary:     Effort: Pulmonary effort is normal.     Breath sounds: Normal breath sounds.  Abdominal:     General: There is no distension.     Palpations: Abdomen is soft.     Tenderness: There is no abdominal  tenderness.  Genitourinary:    Comments: deferred Musculoskeletal:        General: No deformity.  Lymphadenopathy:     Cervical: No cervical adenopathy.  Skin:    General: Skin is warm and dry.  Neurological:     Mental Status: She is alert and oriented to person, place, and time.     Motor: No weakness.     Gait: Gait normal.  Psychiatric:        Mood and Affect: Mood normal.        Behavior: Behavior normal.      CMP Latest Ref Rng & Units 05/28/2016  Glucose 65 - 99 mg/dL 122(H)  BUN 6 - 20 mg/dL 12  Creatinine 0.44 - 1.00 mg/dL 0.81  Sodium 135 - 145 mmol/L 132(L)  Potassium 3.5 - 5.1 mmol/L 4.0  Chloride 101 - 111 mmol/L 102  CO2 22 - 32 mmol/L 23  Calcium 8.9 - 10.3 mg/dL 9.2  Total Protein 6.5 - 8.1 g/dL 7.6  Total Bilirubin 0.3 - 1.2 mg/dL 0.5  Alkaline Phos 38 - 126 U/L 100  AST 15 - 41 U/L 22  ALT 14 - 54 U/L 22   CBC Latest Ref Rng & Units 05/28/2016  WBC 3.6 - 11.0 K/uL 17.4(H)  Hemoglobin 12.0 - 16.0 g/dL 14.8  Hematocrit 35.0 - 47.0 % 41.7  Platelets 150 - 440 K/uL 206    No images are attached to the encounter.  No results found.  Assessment and plan- Patient is a 33 y.o. female with history of stage I A1 squamous cell carcinoma of the cervix on LEEP in 09/2014 followed by cone biopsy 11/2014 with CIN-3 with positive ectocervical margin from 9-12:00.  She was fertility desiring at that time and declined hysterectomy.  She has been on surveillance with negative Pap smears since that time.  Last Pap January 2020 was NILM and HR HPV negative.  Today she presents with complaints of dysmenorrhea but is asymptomatic today.   Dysmenorrhea- suspect primary dysmenorrhea.  Encouraged exercise, topical heat, and trial of NSAIDs and/or acetaminophen.  Could consider hormonal options for persistent symptoms.  Would not recommend oral contraceptive pills due to smoking and her age. She previously had pain with IUD.  In setting of history of cervical cancer I will discuss  her symptoms and management options with gyn-oncology as well as her desire for hysterectomy.    Follow up based on recommendations from gyn-onc.    Visit Diagnosis 1. Dysmenorrhea   2. History of cervical cancer     Patient expressed understanding and was in agreement with this plan. She also understands that She can call clinic at any time with any questions, concerns, or complaints.   Thank you for allowing me to participate in the care of this very pleasant patient.   Beckey Rutter, DNP, AGNP-C [facility] [place] (work cell) [phone] (office)  CC: Dr. Fransisca Connors & Dr. Theora Gianotti

## 2019-04-05 ENCOUNTER — Encounter: Payer: Self-pay | Admitting: Nurse Practitioner

## 2019-04-12 ENCOUNTER — Telehealth: Payer: Self-pay | Admitting: *Deleted

## 2019-04-12 NOTE — Telephone Encounter (Signed)
Patient called asking the outcome of the discussion with Surgeon regarding surgery. Please return her call [phone]

## 2019-04-12 NOTE — Telephone Encounter (Addendum)
Patient and spouse informed of appointment and that it is for discussion and consent They had asked if it could be virtual so that they could all hear discussion, I explained that she will need to sign consent so virtual not possible, but that a call could be made while she is in the room so he could hear discussion also, they were in agreement with this plan and repeated appointment date and time back to me twice

## 2019-04-12 NOTE — Telephone Encounter (Signed)
Per secure chat from Beckey Rutter, NP: could you call and let her know- dr Fransisca Connors says sure and bring her in to discuss and consent. appt made for 9/16 (can't remember time but think 3:30

## 2019-04-12 NOTE — Telephone Encounter (Signed)
Dr. Fransisca Connors agrees with surgery request. Appointment made with gyn-onc to discuss further. Asked Hassan Rowan, RN to call to update patient.

## 2019-05-02 ENCOUNTER — Other Ambulatory Visit: Payer: Self-pay

## 2019-05-03 ENCOUNTER — Other Ambulatory Visit: Payer: Self-pay

## 2019-05-03 ENCOUNTER — Encounter: Payer: Self-pay | Admitting: Obstetrics and Gynecology

## 2019-05-03 ENCOUNTER — Inpatient Hospital Stay: Payer: Medicaid Other | Attending: Obstetrics and Gynecology | Admitting: Obstetrics and Gynecology

## 2019-05-03 VITALS — BP 139/65 | HR 77 | Temp 98.9°F | Resp 20 | Ht 65.0 in | Wt 186.7 lb

## 2019-05-03 DIAGNOSIS — F819 Developmental disorder of scholastic skills, unspecified: Secondary | ICD-10-CM | POA: Diagnosis not present

## 2019-05-03 DIAGNOSIS — N920 Excessive and frequent menstruation with regular cycle: Secondary | ICD-10-CM | POA: Diagnosis not present

## 2019-05-03 DIAGNOSIS — N946 Dysmenorrhea, unspecified: Secondary | ICD-10-CM | POA: Insufficient documentation

## 2019-05-03 DIAGNOSIS — A630 Anogenital (venereal) warts: Secondary | ICD-10-CM | POA: Insufficient documentation

## 2019-05-03 DIAGNOSIS — F17210 Nicotine dependence, cigarettes, uncomplicated: Secondary | ICD-10-CM | POA: Diagnosis not present

## 2019-05-03 DIAGNOSIS — K580 Irritable bowel syndrome with diarrhea: Secondary | ICD-10-CM | POA: Insufficient documentation

## 2019-05-03 DIAGNOSIS — F329 Major depressive disorder, single episode, unspecified: Secondary | ICD-10-CM | POA: Insufficient documentation

## 2019-05-03 DIAGNOSIS — C539 Malignant neoplasm of cervix uteri, unspecified: Secondary | ICD-10-CM | POA: Insufficient documentation

## 2019-05-03 DIAGNOSIS — F319 Bipolar disorder, unspecified: Secondary | ICD-10-CM | POA: Diagnosis not present

## 2019-05-03 DIAGNOSIS — M1990 Unspecified osteoarthritis, unspecified site: Secondary | ICD-10-CM | POA: Insufficient documentation

## 2019-05-03 DIAGNOSIS — K219 Gastro-esophageal reflux disease without esophagitis: Secondary | ICD-10-CM | POA: Insufficient documentation

## 2019-05-03 DIAGNOSIS — Z8541 Personal history of malignant neoplasm of cervix uteri: Secondary | ICD-10-CM | POA: Diagnosis not present

## 2019-05-03 DIAGNOSIS — R918 Other nonspecific abnormal finding of lung field: Secondary | ICD-10-CM | POA: Insufficient documentation

## 2019-05-03 NOTE — Progress Notes (Signed)
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 female who returns to clinic today for consideration of surgery for history cervical dysplasia/stage I A1 squamous cell status post LEEP as she was fertility desiring at time of diagnosis.  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   Normal PAP and HPV 1/20. No complaints today. Does have menorrhagia and dysmenorrhea and desires completion hysterectomy.    Incidental pulmonary nodules seen on imaging have been followed with below result:  07/29/18- CT Chest WO Contrast IMPRESSION: 1. Stable small pulmonary nodules from 08/04/2017, consistent with benign findings. No additional follow-up necessary per consensus guidelines. This recommendation follows the consensus statement: Guidelines for Management of Small Pulmonary Nodules Detected on CT Images: From the Fleischner Society 2017; Radiology 2017; 284:228-243. 2. No acute findings.  She has not returned to see Dr. Bernardo Heater for recommended cystoscopy for persistent microscopic hematuria.  She has not established care with a PCP.  She continues to smoke 1/2-1 pack/day of cigarettes.     History:   This G1 P1 patient was seen by Dr Janice Norrie for evaluation of a PAP 04/25/14 showing ASCUS and HPV HR+. She also had heavy menstrual periods, some bleeding in between and inability to conceive. Her husband has a low sperm count due to chemotherapy for recurrent Hodgkins disease.   Dr. Laymond Purser in [place] performed a colposcopy in 10/15.  A large mosaic lesion was noted on the anterior cervix and biopsy showed high-grade squamous intraepithelial lesion.  A LEEP was planned and the patient also requested  a tubal ligation.  After discussion of various contraceptive methods the patient elected to have a Mirena IUD inserted as it would help with her menorrhagia and would be reversible.   On September 25, 2014 she underwent LEEP, hysteroscopy and Mirena IUD insertion. The LEEP was done in three pieces (anterior, posterior, endocervix). 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  Margins were positive and there was endocervical gland involvement.  This pathology was reviewed and confirmed at the [facility] by Dr Alisia Ferrari.  Endometrium was proliferative.  She underwent cone biopsy at [facility] with Dr Fransisca Connors on 11/21/14. Mirena IUD was removed at that time.     A. ECTOCERVIX; CONIZATION:  - HIGH-GRADE SQUAMOUS INTRAEPITHELIAL LESION (CIN 2-3).  - AN ECTOCERVICAL MARGIN IS INVOLVED IN THE 9 TO 12:00 QUADRANT.  - THE ENDOCERVICAL MARGINS OF EXCISION ARE NEGATIVE   B. ENDOCERVIX; CONIZATION:  - BENIGN FIBROUS TISSUE.   9/16 PAP normal and HR HPV was negative.  11/13/2015 Pap negative; HRHPV negative.  3/18 Normal PAP/HPV.  3/18 Pelvic US  and CT scan to work up pain basically normal.  CT scan abd/pelvis IMPRESSION: No acute findings within the abdomen or pelvis.  4/18 Chest CT 6 mm pulmonary nodule in inferior right middle lobe. Non-contrast chest CT at 6-12 months is recommended. If the nodule is stable at time of repeat CT, then future CT at 18-24 months (from today's scan) is considered optional for low-risk patients, but is recommended for high-risk patients. This recommendation follows the consensus statement: Guidelines for Management of Incidental Pulmonary Nodules Detected on  CT Images: From the Fleischner Society 2017; Radiology 2017; [id number].   She has a history of tobacco use, a learning disorder, seizures and significant bipolar mood disorder and is  taking Celexa. She has a history of depression with hospitalization and suicide attempt.  Her husband having Non-Hodgkin's Lymphoma and is sterile.  06/2017-normal Pap/HPV.  At that time she reported UTI-like symptoms.  UA was negative for infection but noted to have persistent microhematuria since 2016.  She was referred to urology and saw Dr. Bernardo Heater on 08/05/2017.  Given previous negative imaging, cystoscopy was recommended for further evaluation.  She has not followed up.  08/04/17-CT chest without contrast for reassessment of pulmonary nodule 1. Stable right middle lobe subpleural nodule measuring 6 mm compared to 11/17/2016. 2. There is a 3 mm nodule in the right upper lobe.  Not imaged on previous studies.  Noncontrast chest CT can be considered in 12 months if patient is high risk.  This recommendation follows the consensus statement: Guidelines for management of incidental pulmonary nodules detected on CT images: From the Fleischner Society 2017; radiology 2017; 284: 228-243.   She was seen in ER on 09/22/17 nonproductive cough, body aches, fever, and dysuria. Diagnosed with bronchitis and UTI, although Culture suggested re-collection. She was treated with keflex and azithromycin and directed to follow-up with PCP.    Problem List: Patient Active Problem List   Diagnosis Date Noted  . Dysmenorrhea 04/04/2019  . History of cervical cancer 04/04/2019  . Microscopic hematuria 08/05/2017  . Abdominal pain 11/03/2016  . Diarrhea 10/21/2016  . Diarrhea of presumed infectious origin 10/21/2016  . Tobacco use disorder 05/13/2016  . Dysuria 05/15/2015  . Cervical intraepithelial neoplasia grade III with severe dysplasia 01/23/2015    Past Medical History: Past Medical History:  Diagnosis Date  . Acid reflux disease   . Arthritis   . Bipolar 1 disorder ([place])   . Cervical cancer ([place])   . IBS (irritable bowel syndrome)   . Lung nodules   . Panic attack   . Seizures ([place])     Past  Surgical History: Past Surgical History:  Procedure Laterality Date  . biospy    . CERVICAL BIOPSY      Family History: Family History  Problem Relation Age of Onset  . COPD Father   . Heart disease Father   . Hypertension Maternal Grandfather   . Heart disease Maternal Grandmother   . Diverticulosis Maternal Grandmother   . Cervical cancer Maternal Grandmother   . Stroke Maternal Aunt   . Cancer Maternal Aunt     Social History: Social History   Socioeconomic History  . Marital status: Married    Spouse name: Not on file  . Number of children: Not on file  . Years of education: Not on file  . Highest education level: Not on file  Occupational History  . Not on file  Social Needs  . Financial resource strain: Not on file  . Food insecurity    Worry: Not on file    Inability: Not on file  . Transportation needs    Medical: Not on file    Non-medical: Not on file  Tobacco Use  . Smoking status: Current Every Day Smoker    Packs/day: 1.50    Years: 15.00    Pack years: 22.50    Types: Cigarettes  . Smokeless tobacco: Former Systems developer    Quit date: 01/22/2005  Substance and Sexual Activity  . Alcohol use: Yes  Comment: occasional beer rare about 5 years ago  . Drug use: No  . Sexual activity: Yes  Lifestyle  . Physical activity    Days per week: Not on file    Minutes per session: Not on file  . Stress: Not on file  Relationships  . Social Herbalist on phone: Not on file    Gets together: Not on file    Attends religious service: Not on file    Active member of club or organization: Not on file    Attends meetings of clubs or organizations: Not on file    Relationship status: Not on file  . Intimate partner violence    Fear of current or ex partner: Not on file    Emotionally abused: Not on file    Physically abused: Not on file    Forced sexual activity: Not on file  Other Topics Concern  . Not on file  Social History Narrative  . Not on  file    Allergies: Allergies  Allergen Reactions  . Dilaudid [Hydromorphone Hcl]   . Morphine And Related   . Paxil [Paroxetine Hcl]     Current Medications: Current Outpatient Medications  Medication Sig Dispense Refill  . ibuprofen (ADVIL,MOTRIN) 200 MG tablet Take 200 mg by mouth every 6 (six) hours as needed.     No current facility-administered medications for this visit.    Review of Systems General:  no complaints Skin: no complaints Eyes: no complaints HEENT: no complaints Breasts: no complaints Pulmonary: chronic cough Cardiac: no complaints Gastrointestinal: abdominal pain Genitourinary/Sexual: no complaints Ob/Gyn: pelvic pain Musculoskeletal: back pain Hematology: no complaints Neurologic/Psych: no complaints  Objective:  Physical Examination:  BP 139/65 (BP Location: Left Arm, Patient Position: Sitting)   Pulse 77   Temp 98.9 F (37.2 C) (Tympanic)   Resp 20   Ht 5\' 5"  (1.651 m)   Wt 186 lb 11.2 oz (84.7 kg)   BMI 31.07 kg/m   GENERAL: Patient is a well appearing female in no acute distress HEENT: poor dentition NODES:  No cervical, supraclavicular, axillary, or inguinal lymphadenopathy palpated.  LUNGS:  Clear to auscultation bilaterally.  No wheezes or rhonchi. HEART:  Regular rate and rhythm. No murmur appreciated. ABDOMEN:  Soft, nontender.  Positive, normoactive bowel sounds.  MSK:  No focal spinal tenderness to palpation. Full range of motion bilaterally in the upper extremities. EXTREMITIES:  No peripheral edema.   SKIN:  Clear with no obvious rashes or skin changes. No nail dyscrasia. NEURO:  Nonfocal. Well oriented.  Appropriate affect.  Pelvic: Exam Chaperoned by RN EGBUS: single white lesion on right labia minor. Image available under media tab.  Cervix: flush with upper vault. No palpable lesions. Changes consistent with prior LEEP.  Vagina: no lesions, no discharge or bleeding Uterus: normal size, nontender, mobile Adnexa: no  palpable masses. Non-tender to palpation Rectovaginal: deferred   Assessment:  Terry Walters is a 33 y.o. female with a history of Stage 1A1 squamous cell carcinoma of the cervix on LEEP in 2/16.  Cone biopsy 4/16 with CIN III with positive ectocervical margin from 9-12 o'clock. She did not wish to proceed with with hysterectomy unless it is absolutely needed.  Although she had a positive ectocervical margin, it is possible that all the CIN III was all removed. Normal PAP and HR HPV in 9/16, 3/17, 3/18, 11/18, 5/19, 1/20. Now decided she wants completion hysterectomy due to painful heavy periods.  This is certainly reasonable  in view of prior cervix cancer treated with Cone, even though PAPs have been normal.   Repeat CT Chest in 07/2018 to follow-up on lung nodules was normal and additional follow up scans not needed.   Medical comorbidities: Tobacco usage, psychiatric history, learning disability  Plan:   Problem List Items Addressed This Visit      Other   History of cervical cancer - Primary     She is scheduled for TLH/BS 05/24/19, possible oophorectomy if abnormal ovaries but not anticipated. Possible X lap if bleeding, adhesions.  The risks of surgery were discussed in detail and she understands these to include infection; wound separation; hernia; vaginal cuff separation, injury to adjacent organs such as bowel, bladder, blood vessels, ureters and nerves; bleeding which may require blood transfusion; anesthesia risk; thromboembolic events; possible death; unforeseen complications; possible need for re-exploration; medical complications such as heart attack, stroke, pleural effusion and pneumonia; and, if staging performed the risk of lymphedema and lymphocyst.  The patient will receive DVT and antibiotic prophylaxis as indicated.  Will do single dose of pre op heparin since she smokes. She voiced a clear understanding.  She had the opportunity to ask questions and written informed consent  was obtained today.   She is comfortable with this plan and multiple questions answered  Beckey Rutter, DNP, AGNP-C [facility] [place] (work cell) [phone] (office)  I personally interviewed and examined the patient. Agreed with the above/below plan of care. Patient/family questions were answered.  Mellody Drown, MD

## 2019-05-03 NOTE — Patient Instructions (Signed)
YOUR SURGERY WILL BE SCHEDULED FOR 05/24/2019 I WILL CALL YOU WITH AN APPOINTMENT FOR PRE-ADMIT TESTING. THIS WILL ALSO INCLUDE AN APPOINTMENT FOR COVID-19 TESTING (see separate paper)               DIVISION OF GYNECOLOGIC ONCOLOGY BOWEL PREP   The following instructions are extremely important to prepare for your surgery. Please follow them carefully   Step 1: Liquid Diet Instructions              Clear Liquid Diet for GYN Oncology Patients Day Before Surgery The day before your scheduled surgery DO NOT EAT any solid foods.  We do want you to drink enough liquids, but NO MILK products.  We do not want you to be dehydrated.  Clear liquids are defined as no milk products and no pieces of any solid food. Drink at least 64 oz. of fluid.  The following are all approved for you to drink the day before you surgery.  Chicken, Beef or Vegetable Broth (bouillon or consomm) - NO BROTH AFTER MIDNIGHT  Plain Jello  (no fruit)  Water  Strained lemonade or fruit punch  Gatorade (any flavor)  CLEAR Ensure or Boost Breeze  Fruit juices without pulp, such as apple, grape, or cranberry juice  Clear sodas - NO SODA AFTER MIDNIGHT  Ice Pops without bits of fruit or fruit pulp  Honey  Tea or coffee without milk or cream                 Any foods not on the above list should be avoided                                                                                             Step 2: Laxatives           The evening before surgery:   Time: around 5pm   Follow these instructions carefully.   Administer 1 Dulcolax suppository according to manufacturer instructions on the box. You will need to purchase this laxative at a pharmacy or grocery store.    Individual responses to laxatives vary; this prep may cause multiple bowel movements. It often works in 30 minutes and may take as long as 3 hours. Stay near an available bathroom.    It is important to stay hydrated. Ensure you are still  drinking clear liquids.     IMPORTANT: FOR YOUR SAFETY, WE WILL HAVE TO CANCEL YOUR SURGERY IF YOU DO NOT FOLLOW THESE INSTRUCTIONS.    Do not eat anything after midnight (including gum or candy) prior to your surgery.  Avoid drinking carbonated beverages after midnight.  You can have clear liquids up until one hour before you arrive at the hospital. "Nothing by mouth" means no liquids, gum, candy, etc for one hour before your arrival time.      Laparoscopy Laparoscopy is a procedure to diagnose diseases in the abdomen. During the procedure, a thin, lighted, pencil-sized instrument called a laparoscope is inserted into the abdomen through an incision. The laparoscope allows your health care provider to look at the organs inside your body. [place]  CARE PROVIDER KNOW ABOUT:  Any allergies you have.  All medicines you are taking, including vitamins, herbs, eye drops, creams, and over-the-counter medicines.  Previous problems you or members of your family have had with the use of anesthetics.  Any blood disorders you have.  Previous surgeries you have had.  Medical conditions you have. RISKS AND COMPLICATIONS  Generally, this is a safe procedure. However, problems can occur, which may include:  Infection.  Bleeding.  Damage to other organs.  Allergic reaction to the anesthetics used during the procedure. BEFORE THE PROCEDURE  Do not eat or drink anything after midnight on the night before the procedure or as directed by your health care provider.  Ask your health care provider about: ? Changing or stopping your regular medicines. ? Taking medicines such as aspirin and ibuprofen. These medicines can thin your blood. Do not take these medicines before your procedure if your health care provider instructs you not to.  Plan to have someone take you home after the procedure. PROCEDURE  You may be given a medicine to help you relax (sedative).  You will be given a  medicine to make you sleep (general anesthetic).  Your abdomen will be inflated with a gas. This will make your organs easier to see.  Small incisions will be made in your abdomen.  A laparoscope and other small instruments will be inserted into the abdomen through the incisions.  A tissue sample may be removed from an organ in the abdomen for examination.  The instruments will be removed from the abdomen.  The gas will be released.  The incisions will be closed with stitches (sutures). AFTER THE PROCEDURE  Your blood pressure, heart rate, breathing rate, and blood oxygen level will be monitored often until the medicines you were given have worn off.   This information is not intended to replace advice given to you by your health care provider. Make sure you discuss any questions you have with your health care provider.                                             Bowel Symptoms After Surgery After gynecologic surgery, women often have temporary changes in bowel function (constipation and gas pain).  Following are tips to help prevent and treat common bowel problems.  It also tells you when to call the doctor.  This is important because some symptoms might be a sign of a more serious bowel problem such as obstruction (bowel blockage).  These problems are rare but can happen after gynecologic surgery.   Besides surgery, what can temporarily affect bowel function? 1. Dietary changes   2. Decreased physical activity   3.Antibiotics   4. Pain medication   How can I prevent constipation (three days or more without a stool)? 1. Include fiber in your diet: whole grains, raw or dried fruits & vegetables, prunes, prune/pear juiceDrink at least 8 glasses of liquid (preferably water) every day 2. Avoid: ? Gas forming foods such as broccoli, beans, peas, salads, cabbage, sweet potatoes ? Greasy, fatty, or fried foods 3. Activity helps bowel function return to normal, walk around the house at  least 3-4 times each day for 15 minutes or longer, if tolerated.  Rocking in a rocking chair is preferable to sitting still. 4. Stool softeners: these are not laxatives, but serve to soften the stool to  avoid straining.  Take 2-4 times a day until normal bowel function returns         Examples: Colace or generic equivalent (Docusate) 5. Bulk laxatives: provide a concentrated source of fiber.  They do not stimulate the bowel.  Take 1-2 times each day until normal bowel function return.              Examples: Citrucel, Metamucil, Fiberal, Fibercon   What can I take for "Gas Pains"? 1. Simethicone (Mylicon, Gas-X, Maalox-Gas, Mylanta-Gas) take 3-4 times a day 2. Maalox Regular - take 3-4 times a day 3. Mylanta Regular - take 3-4 times a day   What can I take if I become constipated? 1. Start with stool softeners and add additional laxatives below as needed to have a bowel movement every 1-2 days  2. Stool softeners 1-2 tablets, 2 times a day 3. Senokot 1-2 tablets, 1-2 times a day 4. Glycerin suppository can soften hard stool take once a day 5. Bisacodyl suppository once a day  6. Milk of Magnesia 30 mL 1-2 times a day 7. Fleets or tap water enema    What can I do for nausea?  1. Limit most solid foods for 24-48 hours 2. Continue eating small frequent amounts of liquids and/or bland soft foods ? Toast, crackers, cooked cereal (grits, cream of wheat, rice) 3. Benadryl: a mild anti-nausea medicine can be obtained without a prescription. May cause drowsiness, especially if taken with narcotic pain medicines 4. Contact provider for prescription nausea medication     What can I do, or take for diarrhea (more than five loose stools per day)? 1. Drink plenty of clear fluids to prevent dehydration 2. May take Kaopectate, Pepto-Bismol, Imodium, or probiotics for 1-2 days 3. Anusol or Preparation-H can be helpful for hemorrhoids and irritated tissue around anus   When should I call the doctor?              CONSTIPATION:   Not relieved after three days following the above program VOMITING:  That contains blood, "coffee ground" material  More the three times/hour and unable to keep down nausea medication for more than eight hours  With dry mouth, dark or strong urine, feeling light-headed, dizzy, or confused  With severe abdominal pain or bloating for more than 24 hours DIARRHEA:  That continues for more then 24-48 hours despite treatment  That contains blood or tarry material  With dry mouth, dark or strong urine, feeling light~headed, dizzy, or confused FEVER:  101 F or higher along with nausea, vomiting, gas pain, diarrhea UNABLE TO:  Pass gas from rectum for more than 24 hours  Tolerate liquids by mouth for more than 24 hours        Laparoscopic Hysterectomy, Care After Refer to this sheet in the next few weeks. These instructions provide you with information on caring for yourself after your procedure. Your health care provider may also give you more specific instructions. Your treatment has been planned according to current medical practices, but problems sometimes occur. Call your health care provider if you have any problems or questions after your procedure. What can I expect after the procedure?  Pain and bruising at the incision sites. You will be given pain medicine to control it.  Menopausal symptoms such as hot flashes, night sweats, and insomnia if your ovaries were removed.  Sore throat from the breathing tube that was inserted during surgery. Follow these instructions at home:  Only take over-the-counter or prescription medicines  for pain, discomfort, or fever as directed by your health care provider.  Do not take aspirin. It can cause bleeding.  Do not drive when taking pain medicine.  Follow your health care provider's advice regarding diet, exercise, lifting, driving, and general activities.  Resume your usual diet as directed and  allowed.  Get plenty of rest and sleep.  Do not douche, use tampons, or have sexual intercourse for at least 6 weeks, or until your health care provider gives you permission.  Change your bandages (dressings) as directed by your health care provider.  Monitor your temperature and notify your health care provider of a fever.  Take showers instead of baths for 2-3 weeks.  Do not drink alcohol until your health care provider gives you permission.  If you develop constipation, you may take a mild laxative with your health care provider's permission. Bran foods may help with constipation problems. Drinking enough fluids to keep your urine clear or pale yellow may help as well.  Try to have someone home with you for 1-2 weeks to help around the house.  Keep all of your follow-up appointments as directed by your health care provider. Contact a health care provider if:  You have swelling, redness, or increasing pain around your incision sites.  You have pus coming from your incision.  You notice a bad smell coming from your incision.  Your incision breaks open.  You feel dizzy or lightheaded.  You have pain or bleeding when you urinate.  You have persistent diarrhea.  You have persistent nausea and vomiting.  You have abnormal vaginal discharge.  You have a rash.  You have any type of abnormal reaction or develop an allergy to your medicine.  You have poor pain control with your prescribed medicine. Get help right away if:  You have chest pain or shortness of breath.  You have severe abdominal pain that is not relieved with pain medicine.  You have pain or swelling in your legs. This information is not intended to replace advice given to you by your health care provider. Make sure you discuss any questions you have with your health care provider. Document Released: 05/24/2013 Document Revised: 01/09/2016 Document Reviewed: 02/21/2013 Elsevier Interactive Patient Education   2017 [employer].

## 2019-05-03 NOTE — Progress Notes (Signed)
Here today to discuss surgery. Pre and post operative teaching completed. Provided copy of teaching and prep in printed AVS. Surgery will be scheduled for 05/24/19. She will be instructed regarding PAT appointment. Provided information/dte/time for COVID-19 testing. Post operative appointment with Dr. Fransisca Connors scheduled for 11/18 and provided with printed instructions.

## 2019-05-05 NOTE — Progress Notes (Signed)
Surgery has been scheduled for 05/24/2019 with Dr. Andreas Ohm.Copy of consent faxed to PAT with confirmation of receipt. Original consent sent to medical records for filing.

## 2019-05-15 ENCOUNTER — Telehealth: Payer: Self-pay

## 2019-05-15 NOTE — Telephone Encounter (Signed)
Called and notified Terry Walters, with her PAT appointment details. PAT appointment is scheduled for 05/18/19 around 1045 and is a phone interview. She will have her COVID-19 test performed 10/220 between the hours of 0830-1030. Directions to the [facility] drive-thru given. Read back performed.

## 2019-05-18 ENCOUNTER — Emergency Department: Payer: Medicaid Other

## 2019-05-18 ENCOUNTER — Other Ambulatory Visit: Payer: Self-pay

## 2019-05-18 ENCOUNTER — Encounter
Admission: RE | Admit: 2019-05-18 | Discharge: 2019-05-18 | Disposition: A | Discharge status: Home / Self Care | Payer: Medicaid Other | Source: Ambulatory Visit | Attending: Obstetrics & Gynecology | Admitting: Obstetrics & Gynecology

## 2019-05-18 ENCOUNTER — Emergency Department
Admission: EM | Admit: 2019-05-18 | Discharge: 2019-05-18 | Disposition: A | Discharge status: Home / Self Care | Payer: Medicaid Other | Attending: Emergency Medicine | Admitting: Emergency Medicine

## 2019-05-18 DIAGNOSIS — Z5321 Procedure and treatment not carried out due to patient leaving prior to being seen by health care provider: Secondary | ICD-10-CM | POA: Diagnosis not present

## 2019-05-18 DIAGNOSIS — Z01818 Encounter for other preprocedural examination: Secondary | ICD-10-CM | POA: Diagnosis not present

## 2019-05-18 DIAGNOSIS — M25471 Effusion, right ankle: Secondary | ICD-10-CM | POA: Insufficient documentation

## 2019-05-18 HISTORY — DX: Depression, unspecified: F32.A

## 2019-05-18 NOTE — ED Triage Notes (Signed)
Patient coming [facility] from home for right ankle swelling approx 1 hour ago. Patient rolled ankle per medic. IV established in transports. EMS vitals signs stable.

## 2019-05-18 NOTE — Patient Instructions (Signed)
Your procedure is scheduled on: 05-24-19 [facility] Report to Same Day Surgery 2nd floor medical mall [facility] Entrance-take elevator on left to 2nd floor.  Check in with surgery information desk.) To find out your arrival time please call [phone] between 1PM - 3PM on 05-23-19 TUESDAY  Remember: Instructions that are not followed completely may result in serious medical risk, up to and including death, or upon the discretion of your surgeon and anesthesiologist your surgery may need to be rescheduled.    _x___ 1. Do not eat food after midnight the night before your procedure. NO GUM OR CANDY AFTER MIDNIGHT. You may drink clear liquids up to 2 hours before you are scheduled to arrive at the hospital for your procedure.  Do not drink clear liquids within 2 hours of your scheduled arrival to the hospital.  Clear liquids include  --Water or Apple juice without pulp  --Gatorade  --Black Coffee or Clear Tea (No milk, no creamers, do not add anything to the coffee or Tea   ____Ensure clear carbohydrate drink on the way to the hospital for bariatric patients  ____Ensure clear carbohydrate drink 3 hours before surgery.     __x__ 2. No Alcohol for 24 hours before or after surgery.   __x__3. No Smoking or e-cigarettes for 24 prior to surgery.  Do not use any chewable tobacco products for at least 6 hour prior to surgery   ____  4. Bring all medications with you on the day of surgery if instructed.    __x__ 5. Notify your doctor if there is any change in your medical condition     (cold, fever, infections).    x___6. On the morning of surgery brush your teeth with toothpaste and water.  You may rinse your mouth with mouth wash if you wish.  Do not swallow any toothpaste or mouthwash.   Do not wear jewelry, make-up, hairpins, clips or nail polish.  Do not wear lotions, powders, or perfumes.  Do not shave 48 hours prior to surgery. Men may shave face and neck.  Do not bring valuables to  the hospital.    [facility] is not responsible for any belongings or valuables.               Contacts, dentures or bridgework may not be worn into surgery.  Leave your suitcase in the car. After surgery it may be brought to your room.  For patients admitted to the hospital, discharge time is determined by your treatment team.  _  Patients discharged the day of surgery will not be allowed to drive home.  You will need someone to drive you home and stay with you the night of your procedure.    Please read over the following fact sheets that you were given:   [facility] Preparing for Surgery   ____ Take anti-hypertensive listed below, cardiac, seizure, asthma, anti-reflux and psychiatric medicines. These include:  1. NONE  2.  3.  4.  5.  6.  _X___Fleets enema as directed-DO FLEET ENEMA AT HOME THE MORNING OF SURGERY ONE HOUR PRIOR TO YOUR ARRIVAL TIME TO HOSPITAL   _x___ Use CHG Soap or sage wipes as directed on instruction sheet   ____ Use inhalers on the day of surgery and bring to hospital day of surgery  ____ Stop Metformin and Janumet 2 days prior to surgery.    ____ Take 1/2 of usual insulin dose the night before surgery and none on the morning surgery.  ____ Follow recommendations from Cardiologist, Pulmonologist or PCP regarding stopping Aspirin, Coumadin, Plavix ,Eliquis, Effient, or Pradaxa, and Pletal.  X____Stop Anti-inflammatories such as Advil, Aleve, Ibuprofen, Motrin, Naproxen, Naprosyn, Goodies powders or aspirin products NOW-OK to take Tylenol    ____ Stop supplements until after surgery.     ____ Bring C-Pap to the hospital.

## 2019-05-18 NOTE — ED Triage Notes (Signed)
Pt fell and rolled right ankle, large amount of swelling noted with small abrasion.

## 2019-05-19 ENCOUNTER — Other Ambulatory Visit: Payer: Medicaid Other

## 2019-05-19 ENCOUNTER — Other Ambulatory Visit: Admission: RE | Admit: 2019-05-19 | Payer: Medicaid Other | Source: Ambulatory Visit

## 2019-05-19 ENCOUNTER — Telehealth: Payer: Self-pay | Admitting: *Deleted

## 2019-05-19 ENCOUNTER — Telehealth: Payer: Self-pay

## 2019-05-19 NOTE — Telephone Encounter (Signed)
Call returned to spouse, Barbaraann Rondo. Mrs. Roeber is requesting to reschedule her surgery due to her foor injury. She states she was instructed to stay off of it for two weeks. Unable to locate ED physician note. Surgery cancelled. Notified the OR, Dr. Andreas Ohm. She will need to call and reschedule appointment/surgery once she is able. He verbalized understanding.

## 2019-05-19 NOTE — Telephone Encounter (Signed)
Patient s/o called reporting that patient went to ER last night as she broke her foot and cannot walk. She has an appointment for COVID test and las today pre surgery and that needs to be cancelled as well as rescheduling her surgery. Please advise

## 2019-05-19 NOTE — Telephone Encounter (Signed)
Thank you for the update. I will cancel her surgery and reach out to her.

## 2019-05-24 ENCOUNTER — Ambulatory Visit
Admission: RE | Admit: 2019-05-24 | Payer: Medicaid Other | Source: Home / Self Care | Admitting: Obstetrics & Gynecology

## 2019-05-24 ENCOUNTER — Encounter: Admission: RE | Payer: Self-pay | Source: Home / Self Care

## 2019-05-24 SURGERY — HYSTERECTOMY, TOTAL, LAPAROSCOPIC
Anesthesia: General

## 2019-05-30 ENCOUNTER — Telehealth: Payer: Self-pay

## 2019-05-30 NOTE — Telephone Encounter (Signed)
Received notification from Mrs. Terry Walters that she is ready to reschedule her surgery. I have reached out to Dr. Guido Sander office to coordinate a date. We will contact her once date is determined. Mrs. Terry Walters verbalized understanding.

## 2019-06-16 ENCOUNTER — Telehealth: Payer: Self-pay

## 2019-06-16 NOTE — Telephone Encounter (Signed)
Surgery has been rescheduled for 07/05/19. Notified with PAT and COVID-19 testing dates/time/location. Read back performed.

## 2019-06-21 ENCOUNTER — Other Ambulatory Visit: Payer: Self-pay | Admitting: Obstetrics and Gynecology

## 2019-06-21 ENCOUNTER — Inpatient Hospital Stay: Payer: Medicaid Other

## 2019-06-28 ENCOUNTER — Other Ambulatory Visit: Payer: Self-pay

## 2019-06-28 ENCOUNTER — Encounter
Admission: RE | Admit: 2019-06-28 | Discharge: 2019-06-28 | Disposition: A | Discharge status: Home / Self Care | Payer: Medicaid Other | Source: Ambulatory Visit | Attending: Obstetrics and Gynecology | Admitting: Obstetrics and Gynecology

## 2019-06-28 DIAGNOSIS — Z01812 Encounter for preprocedural laboratory examination: Secondary | ICD-10-CM | POA: Insufficient documentation

## 2019-06-28 DIAGNOSIS — Z8541 Personal history of malignant neoplasm of cervix uteri: Secondary | ICD-10-CM

## 2019-06-28 LAB — URINALYSIS, ROUTINE W REFLEX MICROSCOPIC
Bilirubin Urine: NEGATIVE
Glucose, UA: NEGATIVE mg/dL
Ketones, ur: NEGATIVE mg/dL
Leukocytes,Ua: NEGATIVE
Nitrite: NEGATIVE
Protein, ur: NEGATIVE mg/dL
Specific Gravity, Urine: 1.023 (ref 1.005–1.030)
pH: 5 (ref 5.0–8.0)

## 2019-06-28 LAB — CBC WITH DIFFERENTIAL/PLATELET
Abs Immature Granulocytes: 0.06 10*3/uL (ref 0.00–0.07)
Basophils Absolute: 0.1 10*3/uL (ref 0.0–0.1)
Basophils Relative: 0 %
Eosinophils Absolute: 0.2 10*3/uL (ref 0.0–0.5)
Eosinophils Relative: 2 %
HCT: 43.2 % (ref 36.0–46.0)
Hemoglobin: 14.4 g/dL (ref 12.0–15.0)
Immature Granulocytes: 0 %
Lymphocytes Relative: 13 %
Lymphs Abs: 1.8 10*3/uL (ref 0.7–4.0)
MCH: 29.4 pg (ref 26.0–34.0)
MCHC: 33.3 g/dL (ref 30.0–36.0)
MCV: 88.3 fL (ref 80.0–100.0)
Monocytes Absolute: 0.6 10*3/uL (ref 0.1–1.0)
Monocytes Relative: 4 %
Neutro Abs: 11 10*3/uL — ABNORMAL HIGH (ref 1.7–7.7)
Neutrophils Relative %: 81 %
Platelets: 255 10*3/uL (ref 150–400)
RBC: 4.89 MIL/uL (ref 3.87–5.11)
RDW: 11.7 % (ref 11.5–15.5)
WBC: 13.7 10*3/uL — ABNORMAL HIGH (ref 4.0–10.5)
nRBC: 0 % (ref 0.0–0.2)

## 2019-06-28 LAB — COMPREHENSIVE METABOLIC PANEL
ALT: 35 U/L (ref 0–44)
AST: 28 U/L (ref 15–41)
Albumin: 4.1 g/dL (ref 3.5–5.0)
Alkaline Phosphatase: 78 U/L (ref 38–126)
Anion gap: 9 (ref 5–15)
BUN: 14 mg/dL (ref 6–20)
CO2: 21 mmol/L — ABNORMAL LOW (ref 22–32)
Calcium: 8.9 mg/dL (ref 8.9–10.3)
Chloride: 106 mmol/L (ref 98–111)
Creatinine, Ser: 0.57 mg/dL (ref 0.44–1.00)
GFR calc Af Amer: >60 mL/min (ref >60)
GFR calc non Af Amer: >60 mL/min (ref >60)
Glucose, Bld: 118 mg/dL — ABNORMAL HIGH (ref 70–99)
Potassium: 3.8 mmol/L (ref 3.5–5.1)
Sodium: 136 mmol/L (ref 135–145)
Total Bilirubin: 0.5 mg/dL (ref 0.3–1.2)
Total Protein: 7.5 g/dL (ref 6.5–8.1)

## 2019-06-28 LAB — TYPE AND SCREEN
ABO/RH(D): A POS
Antibody Screen: NEGATIVE

## 2019-06-28 MED ORDER — FLEET ENEMA 7-19 GM/118ML RE ENEM
1.0000 | ENEMA | Freq: Once | RECTAL | Status: DC
  Filled 2019-06-28: qty 1

## 2019-06-28 NOTE — Pre-Procedure Instructions (Signed)
Fleets enema and carbohydrate drink given along with instructions.

## 2019-06-28 NOTE — Patient Instructions (Addendum)
Your procedure is scheduled on: 07/05/2019 Wed Report to Same Day Surgery 2nd floor medical mall [facility] Entrance-take elevator on left to 2nd floor.  Check in with surgery information desk.) To find out your arrival time please call [phone] between 1PM - 3PM on 07/04/2019 Tues  Remember: Instructions that are not followed completely may result in serious medical risk, up to and including death, or upon the discretion of your surgeon and anesthesiologist your surgery may need to be rescheduled.    _x___ 1. Do not eat food after midnight the night before your procedure. You may drink clear liquids up to 2 hours before you are scheduled to arrive at the hospital for your procedure.  Do not drink clear liquids within 2 hours of your scheduled arrival to the hospital.  Clear liquids include  --Water or Apple juice without pulp  --Clear carbohydrate beverage such as ClearFast or Gatorade  --Black Coffee or Clear Tea (No milk, no creamers, do not add anything to                  the coffee or Tea Type 1 and type 2 diabetics should only drink water.   ____Ensure clear carbohydrate drink on the way to the hospital for bariatric patients  _x___Ensure clear carbohydrate drink 3 hours before surgery. Complete 2 hours prior to coming to surgery.   No gum chewing or hard candies.     __x__ 2. No Alcohol for 24 hours before or after surgery.   __x__3. No Smoking or e-cigarettes for 24 prior to surgery.  Do not use any chewable tobacco products for at least 6 hour prior to surgery   ____  4. Bring all medications with you on the day of surgery if instructed.    __x__ 5. Notify your doctor if there is any change in your medical condition     (cold, fever, infections).    x___6. On the morning of surgery brush your teeth with toothpaste and water.  You may rinse your mouth with mouth wash if you wish.  Do not swallow any toothpaste or mouthwash.   Do not wear jewelry, make-up, hairpins,  clips or nail polish.  Do not wear lotions, powders, or perfumes. You may wear deodorant.  Do not shave 48 hours prior to surgery. Men may shave face and neck.  Do not bring valuables to the hospital.    [facility] is not responsible for any belongings or valuables.               Contacts, dentures or bridgework may not be worn into surgery.  Leave your suitcase in the car. After surgery it may be brought to your room.  For patients admitted to the hospital, discharge time is determined by your                       treatment team.  _  Patients discharged the day of surgery will not be allowed to drive home.  You will need someone to drive you home and stay with you the night of your procedure.    Please read over the following fact sheets that you were given:   [facility] Preparing for Surgery and or MRSA Information   _x___ Take anti-hypertensive listed below, cardiac, seizure, asthma,     anti-reflux and psychiatric medicines. These include:  1. None  2.  3.  4.  5.  6.  __x__Fleets enema or Magnesium Citrate as  directed.   _x___ Use CHG Soap or sage wipes as directed on instruction sheet   ____ Use inhalers on the day of surgery and bring to hospital day of surgery  ____ Stop Metformin and Janumet 2 days prior to surgery.    ____ Take 1/2 of usual insulin dose the night before surgery and none on the morning     surgery.   _x___ Follow recommendations from Cardiologist, Pulmonologist or PCP regarding          stopping Aspirin, Coumadin, Plavix ,Eliquis, Effient, or Pradaxa, and Pletal.  X____Stop Anti-inflammatories such as Advil, Aleve, Ibuprofen, Motrin, Naproxen, Naprosyn, Goodies powders or aspirin products. OK to take Tylenol and                          Celebrex.   _x___ Stop supplements until after surgery.  But may continue Vitamin D, Vitamin B,       and multivitamin.   ____ Bring C-Pap to the hospital.

## 2019-06-29 NOTE — Pre-Procedure Instructions (Signed)
Pre-Admit Testing Provider Notification Note  Provider Notified: Dr. Fransisca Connors AND Dr. Leonides Schanz  Notification Mode: Fax  Reason: Abnormal UA Result  Response: Fax confirmation received.  Additional Information: Placed on chart. Noted on Pre-Admit Worksheet.  Signed: Beulah Gandy, RN

## 2019-06-30 ENCOUNTER — Other Ambulatory Visit: Payer: Self-pay

## 2019-06-30 ENCOUNTER — Other Ambulatory Visit
Admission: RE | Admit: 2019-06-30 | Discharge: 2019-06-30 | Disposition: A | Discharge status: Home / Self Care | Payer: Medicaid Other | Source: Ambulatory Visit | Attending: Obstetrics and Gynecology | Admitting: Obstetrics and Gynecology

## 2019-06-30 DIAGNOSIS — Z20828 Contact with and (suspected) exposure to other viral communicable diseases: Secondary | ICD-10-CM | POA: Insufficient documentation

## 2019-06-30 DIAGNOSIS — Z01812 Encounter for preprocedural laboratory examination: Secondary | ICD-10-CM | POA: Diagnosis present

## 2019-06-30 LAB — SARS CORONAVIRUS 2 (TAT 6-24 HRS): SARS Coronavirus 2: NEGATIVE

## 2019-07-04 MED ORDER — CEFAZOLIN SODIUM-DEXTROSE 2-4 GM/100ML-% IV SOLN
2.0000 g | INTRAVENOUS | Status: AC
  Administered 2019-07-05: 2 g via INTRAVENOUS

## 2019-07-05 ENCOUNTER — Other Ambulatory Visit: Payer: Self-pay

## 2019-07-05 ENCOUNTER — Encounter: Payer: Self-pay | Admitting: Anesthesiology

## 2019-07-05 ENCOUNTER — Ambulatory Visit: Payer: Medicaid Other | Admitting: Anesthesiology

## 2019-07-05 ENCOUNTER — Encounter
Admission: RE | Disposition: A | Discharge status: Home / Self Care | Payer: Self-pay | Source: Home / Self Care | Attending: Obstetrics & Gynecology

## 2019-07-05 ENCOUNTER — Ambulatory Visit: Payer: Medicaid Other

## 2019-07-05 ENCOUNTER — Ambulatory Visit
Admission: RE | Admit: 2019-07-05 | Discharge: 2019-07-05 | Disposition: A | Discharge status: Home / Self Care | Payer: Medicaid Other | Attending: Obstetrics & Gynecology | Admitting: Obstetrics & Gynecology

## 2019-07-05 DIAGNOSIS — N920 Excessive and frequent menstruation with regular cycle: Secondary | ICD-10-CM | POA: Insufficient documentation

## 2019-07-05 DIAGNOSIS — Z888 Allergy status to other drugs, medicaments and biological substances status: Secondary | ICD-10-CM | POA: Diagnosis not present

## 2019-07-05 DIAGNOSIS — Z885 Allergy status to narcotic agent status: Secondary | ICD-10-CM | POA: Insufficient documentation

## 2019-07-05 DIAGNOSIS — C539 Malignant neoplasm of cervix uteri, unspecified: Secondary | ICD-10-CM | POA: Diagnosis present

## 2019-07-05 DIAGNOSIS — N838 Other noninflammatory disorders of ovary, fallopian tube and broad ligament: Secondary | ICD-10-CM | POA: Diagnosis not present

## 2019-07-05 DIAGNOSIS — N72 Inflammatory disease of cervix uteri: Secondary | ICD-10-CM | POA: Insufficient documentation

## 2019-07-05 DIAGNOSIS — N946 Dysmenorrhea, unspecified: Secondary | ICD-10-CM | POA: Insufficient documentation

## 2019-07-05 DIAGNOSIS — F17210 Nicotine dependence, cigarettes, uncomplicated: Secondary | ICD-10-CM | POA: Insufficient documentation

## 2019-07-05 DIAGNOSIS — M170 Bilateral primary osteoarthritis of knee: Secondary | ICD-10-CM | POA: Insufficient documentation

## 2019-07-05 DIAGNOSIS — Z8541 Personal history of malignant neoplasm of cervix uteri: Secondary | ICD-10-CM | POA: Insufficient documentation

## 2019-07-05 DIAGNOSIS — F319 Bipolar disorder, unspecified: Secondary | ICD-10-CM | POA: Diagnosis not present

## 2019-07-05 HISTORY — PX: ROBOTIC ASSISTED TOTAL HYSTERECTOMY WITH BILATERAL SALPINGO OOPHERECTOMY: SHX6086

## 2019-07-05 LAB — ABO/RH: ABO/RH(D): A POS

## 2019-07-05 LAB — POCT PREGNANCY, URINE: Preg Test, Ur: NEGATIVE

## 2019-07-05 SURGERY — HYSTERECTOMY, TOTAL, ROBOT-ASSISTED, LAPAROSCOPIC, WITH BILATERAL SALPINGO-OOPHORECTOMY
Anesthesia: General

## 2019-07-05 MED ORDER — CHLORHEXIDINE GLUCONATE CLOTH 2 % EX PADS
6.0000 | MEDICATED_PAD | Freq: Once | CUTANEOUS | Status: AC
  Administered 2019-07-05: 6 via TOPICAL

## 2019-07-05 MED ORDER — DEXAMETHASONE SODIUM PHOSPHATE 10 MG/ML IJ SOLN
INTRAMUSCULAR | Status: AC
  Filled 2019-07-05: qty 1

## 2019-07-05 MED ORDER — KETOROLAC TROMETHAMINE 30 MG/ML IJ SOLN
INTRAMUSCULAR | Status: AC
  Filled 2019-07-05: qty 4

## 2019-07-05 MED ORDER — MIDAZOLAM HCL 2 MG/2ML IJ SOLN
INTRAMUSCULAR | Status: AC
  Filled 2019-07-05: qty 2

## 2019-07-05 MED ORDER — LIDOCAINE HCL (CARDIAC) PF 100 MG/5ML IV SOSY
PREFILLED_SYRINGE | INTRAVENOUS | Status: DC | PRN
  Administered 2019-07-05: 40 mg via INTRAVENOUS

## 2019-07-05 MED ORDER — FAMOTIDINE 20 MG PO TABS
ORAL_TABLET | ORAL | Status: AC
  Administered 2019-07-05: 20 mg via ORAL
  Filled 2019-07-05: qty 1

## 2019-07-05 MED ORDER — DEXAMETHASONE SODIUM PHOSPHATE 10 MG/ML IJ SOLN
4.0000 mg | INTRAMUSCULAR | Status: AC
  Administered 2019-07-05: 08:00:00 4 mg via INTRAVENOUS

## 2019-07-05 MED ORDER — FENTANYL CITRATE (PF) 100 MCG/2ML IJ SOLN
INTRAMUSCULAR | Status: DC | PRN
  Administered 2019-07-05: 25 ug via INTRAVENOUS
  Administered 2019-07-05 (×2): 50 ug via INTRAVENOUS
  Administered 2019-07-05: 25 ug via INTRAVENOUS

## 2019-07-05 MED ORDER — IBUPROFEN 800 MG PO TABS: 800.0000 mg | ORAL_TABLET | Freq: Four times a day (QID) | ORAL | 1 refills | Status: AC

## 2019-07-05 MED ORDER — SUGAMMADEX SODIUM 200 MG/2ML IV SOLN
INTRAVENOUS | Status: DC | PRN
  Administered 2019-07-05: 100 mg via INTRAVENOUS

## 2019-07-05 MED ORDER — FENTANYL CITRATE (PF) 100 MCG/2ML IJ SOLN
INTRAMUSCULAR | Status: AC
  Administered 2019-07-05: 50 ug via INTRAVENOUS
  Filled 2019-07-05: qty 2

## 2019-07-05 MED ORDER — GABAPENTIN 300 MG PO CAPS
600.0000 mg | ORAL_CAPSULE | ORAL | Status: AC
  Administered 2019-07-05: 07:00:00 600 mg via ORAL

## 2019-07-05 MED ORDER — PROPOFOL 10 MG/ML IV BOLUS
INTRAVENOUS | Status: AC
  Filled 2019-07-05: qty 40

## 2019-07-05 MED ORDER — GABAPENTIN 300 MG PO CAPS
ORAL_CAPSULE | ORAL | Status: AC
  Administered 2019-07-05: 600 mg via ORAL
  Filled 2019-07-05: qty 2

## 2019-07-05 MED ORDER — FENTANYL CITRATE (PF) 100 MCG/2ML IJ SOLN
25.0000 ug | INTRAMUSCULAR | Status: DC | PRN
  Administered 2019-07-05 (×2): 50 ug via INTRAVENOUS

## 2019-07-05 MED ORDER — SCOPOLAMINE 1 MG/3DAYS TD PT72
MEDICATED_PATCH | TRANSDERMAL | Status: AC
  Administered 2019-07-05: 1.5 mg via TRANSDERMAL
  Filled 2019-07-05: qty 1

## 2019-07-05 MED ORDER — INDOCYANINE GREEN 25 MG IV SOLR
INTRAVENOUS | Status: AC
  Filled 2019-07-05: qty 25

## 2019-07-05 MED ORDER — ACETAMINOPHEN 500 MG PO TABS
1000.0000 mg | ORAL_TABLET | ORAL | Status: AC
  Administered 2019-07-05: 07:00:00 1000 mg via ORAL

## 2019-07-05 MED ORDER — BUPIVACAINE HCL (PF) 0.5 % IJ SOLN
INTRAMUSCULAR | Status: AC
  Filled 2019-07-05: qty 30

## 2019-07-05 MED ORDER — SCOPOLAMINE 1 MG/3DAYS TD PT72
1.0000 | MEDICATED_PATCH | TRANSDERMAL | Status: DC
  Administered 2019-07-05: 07:00:00 1.5 mg via TRANSDERMAL

## 2019-07-05 MED ORDER — SUGAMMADEX SODIUM 200 MG/2ML IV SOLN
INTRAVENOUS | Status: AC
  Filled 2019-07-05: qty 2

## 2019-07-05 MED ORDER — CELECOXIB 200 MG PO CAPS
ORAL_CAPSULE | ORAL | Status: AC
  Administered 2019-07-05: 400 mg via ORAL
  Filled 2019-07-05: qty 2

## 2019-07-05 MED ORDER — HEPARIN SODIUM (PORCINE) 5000 UNIT/ML IJ SOLN
INTRAMUSCULAR | Status: AC
  Administered 2019-07-05: 5000 [IU] via SUBCUTANEOUS
  Filled 2019-07-05: qty 1

## 2019-07-05 MED ORDER — LACTATED RINGERS IV SOLN
INTRAVENOUS | Status: DC | PRN
  Administered 2019-07-05: 09:00:00 via INTRAVENOUS

## 2019-07-05 MED ORDER — KETOROLAC TROMETHAMINE 30 MG/ML IJ SOLN
INTRAMUSCULAR | Status: DC | PRN
  Administered 2019-07-05: 30 mg via INTRAVENOUS

## 2019-07-05 MED ORDER — ACETAMINOPHEN 500 MG PO TABS
ORAL_TABLET | ORAL | Status: AC
  Administered 2019-07-05: 1000 mg via ORAL
  Filled 2019-07-05: qty 2

## 2019-07-05 MED ORDER — LIDOCAINE HCL (PF) 2 % IJ SOLN
INTRAMUSCULAR | Status: AC
  Filled 2019-07-05: qty 10

## 2019-07-05 MED ORDER — PHENYLEPHRINE HCL (PRESSORS) 10 MG/ML IV SOLN
INTRAVENOUS | Status: DC | PRN
  Administered 2019-07-05: 100 ug via INTRAVENOUS

## 2019-07-05 MED ORDER — FAMOTIDINE 20 MG PO TABS
20.0000 mg | ORAL_TABLET | Freq: Once | ORAL | Status: AC
  Administered 2019-07-05: 07:00:00 20 mg via ORAL

## 2019-07-05 MED ORDER — ROCURONIUM BROMIDE 100 MG/10ML IV SOLN
INTRAVENOUS | Status: DC | PRN
  Administered 2019-07-05: 40 mg via INTRAVENOUS
  Administered 2019-07-05: 50 mg via INTRAVENOUS

## 2019-07-05 MED ORDER — SODIUM CHLORIDE FLUSH 0.9 % IV SOLN
INTRAVENOUS | Status: AC
  Administered 2019-07-05: 10 mL
  Filled 2019-07-05: qty 20

## 2019-07-05 MED ORDER — HEPARIN SODIUM (PORCINE) 5000 UNIT/ML IJ SOLN
5000.0000 [IU] | Freq: Once | INTRAMUSCULAR | Status: AC
  Administered 2019-07-05: 07:00:00 5000 [IU] via SUBCUTANEOUS

## 2019-07-05 MED ORDER — CELECOXIB 200 MG PO CAPS
400.0000 mg | ORAL_CAPSULE | ORAL | Status: AC
  Administered 2019-07-05: 07:00:00 400 mg via ORAL

## 2019-07-05 MED ORDER — ACETAMINOPHEN 500 MG PO TABS: 1000.0000 mg | ORAL_TABLET | ORAL | Status: DC

## 2019-07-05 MED ORDER — SEVOFLURANE IN SOLN
RESPIRATORY_TRACT | Status: AC
  Filled 2019-07-05: qty 250

## 2019-07-05 MED ORDER — ONDANSETRON HCL 4 MG/2ML IJ SOLN
INTRAMUSCULAR | Status: DC | PRN
  Administered 2019-07-05: 4 mg via INTRAVENOUS

## 2019-07-05 MED ORDER — CEFAZOLIN SODIUM-DEXTROSE 2-4 GM/100ML-% IV SOLN
INTRAVENOUS | Status: AC
  Filled 2019-07-05: qty 100

## 2019-07-05 MED ORDER — ROCURONIUM BROMIDE 50 MG/5ML IV SOLN
INTRAVENOUS | Status: AC
  Filled 2019-07-05: qty 1

## 2019-07-05 MED ORDER — PROPOFOL 10 MG/ML IV BOLUS
INTRAVENOUS | Status: DC | PRN
  Administered 2019-07-05: 150 mg via INTRAVENOUS

## 2019-07-05 MED ORDER — OXYCODONE HCL 5 MG PO TABS: 5.0000 mg | ORAL_TABLET | ORAL | 0 refills | Status: DC | PRN

## 2019-07-05 MED ORDER — FENTANYL CITRATE (PF) 250 MCG/5ML IJ SOLN
INTRAMUSCULAR | Status: AC
  Filled 2019-07-05: qty 5

## 2019-07-05 MED ORDER — LACTATED RINGERS IV SOLN
INTRAVENOUS | Status: DC
  Administered 2019-07-05: 09:00:00 via INTRAVENOUS

## 2019-07-05 MED ORDER — MIDAZOLAM HCL 2 MG/2ML IJ SOLN
INTRAMUSCULAR | Status: DC | PRN
  Administered 2019-07-05: 2 mg via INTRAVENOUS

## 2019-07-05 SURGICAL SUPPLY — 95 items
BAG URINE DRAIN 2000ML AR STRL (UROLOGICAL SUPPLIES) ×4 IMPLANT
BLADE SURG SZ11 CARB STEEL (BLADE) ×8 IMPLANT
CANISTER SUCT 1200ML W/VALVE (MISCELLANEOUS) ×4 IMPLANT
CATH FOLEY 2WAY  5CC 16FR (CATHETERS) ×2
CATH URTH 16FR FL 2W BLN LF (CATHETERS) ×2 IMPLANT
CHLORAPREP W/TINT 26 (MISCELLANEOUS) ×4 IMPLANT
CLOSURE WOUND 1/2 X4 (GAUZE/BANDAGES/DRESSINGS)
CORD BIP STRL DISP 12FT (MISCELLANEOUS) ×4 IMPLANT
COVER TIP SHEARS 8 DVNC (MISCELLANEOUS) ×2 IMPLANT
COVER TIP SHEARS 8MM DA VINCI (MISCELLANEOUS) ×2
COVER WAND RF STERILE (DRAPES) ×4 IMPLANT
CRADLE LAMINECT ARM (MISCELLANEOUS) ×4 IMPLANT
DEFOGGER SCOPE WARMER CLEARIFY (MISCELLANEOUS) ×4 IMPLANT
DERMABOND ADVANCED (GAUZE/BANDAGES/DRESSINGS) ×2
DERMABOND ADVANCED .7 DNX12 (GAUZE/BANDAGES/DRESSINGS) ×2 IMPLANT
DRAPE 3/4 80X56 (DRAPES) ×12 IMPLANT
DRAPE ARM DVNC X/XI (DISPOSABLE) ×2 IMPLANT
DRAPE DA VINCI XI ARM (DISPOSABLE) ×2
DRAPE LAPAROTOMY 100X77 ABD (DRAPES) IMPLANT
DRAPE LAPAROTOMY TRNSV 106X77 (MISCELLANEOUS) IMPLANT
DRAPE LEGGINS SURG 28X43 STRL (DRAPES) ×4 IMPLANT
DRAPE UNDER BUTTOCK W/FLU (DRAPES) ×4 IMPLANT
DRIVER NDL 23- D MEGA 49.7X1.4 (INSTRUMENTS) IMPLANT
DRSG TELFA 3X8 NADH (GAUZE/BANDAGES/DRESSINGS) IMPLANT
DRVR NDL 23- D MEGA 49.7X1.4 (INSTRUMENTS)
ELECT BLADE 6 FLAT ULTRCLN (ELECTRODE) IMPLANT
ELECT CAUTERY BLADE 6.4 (BLADE) IMPLANT
ELECT REM PT RETURN 9FT ADLT (ELECTROSURGICAL) ×4
ELECTRODE REM PT RTRN 9FT ADLT (ELECTROSURGICAL) ×2 IMPLANT
FILTER LAP SMOKE EVAC STRL (MISCELLANEOUS) IMPLANT
GAUZE 4X4 16PLY RFD (DISPOSABLE) IMPLANT
GLOVE PI ORTHOPRO 6.5 (GLOVE) ×2
GLOVE PI ORTHOPRO STRL 6.5 (GLOVE) ×2 IMPLANT
GLOVE SURG SYN 6.5 ES PF (GLOVE) ×32 IMPLANT
GOWN STRL REUS W/ TWL LRG LVL3 (GOWN DISPOSABLE) ×16 IMPLANT
GOWN STRL REUS W/ TWL XL LVL3 (GOWN DISPOSABLE) ×2 IMPLANT
GOWN STRL REUS W/TWL LRG LVL3 (GOWN DISPOSABLE) ×16
GOWN STRL REUS W/TWL XL LVL3 (GOWN DISPOSABLE) ×2
GRASPER SUT TROCAR 14GX15 (MISCELLANEOUS) IMPLANT
IRRIGATION STRYKERFLOW (MISCELLANEOUS) IMPLANT
IRRIGATOR STRYKERFLOW (MISCELLANEOUS)
IV NS 1000ML (IV SOLUTION) ×2
IV NS 1000ML BAXH (IV SOLUTION) ×2 IMPLANT
KIT IMAGING PINPOINTPAQ (MISCELLANEOUS) IMPLANT
KIT PINK PAD W/HEAD ARE REST (MISCELLANEOUS) ×4
KIT PINK PAD W/HEAD ARM REST (MISCELLANEOUS) ×2 IMPLANT
KIT TURNOVER CYSTO (KITS) ×4 IMPLANT
LABEL OR SOLS (LABEL) ×4 IMPLANT
MANIPULATOR VCARE LG CRV RETR (MISCELLANEOUS) IMPLANT
MANIPULATOR VCARE STD CRV RETR (MISCELLANEOUS) ×4 IMPLANT
NDL INSUFF 14G 150MM VS150000 (NEEDLE) ×4 IMPLANT
NEEDLE DRIVE SUT CUT DVNC (INSTRUMENTS) IMPLANT
NEEDLE HYPO 22GX1.5 SAFETY (NEEDLE) ×4 IMPLANT
NEEDLE SPNL 22GX5 LNG QUINC BK (NEEDLE) IMPLANT
NEEDLE VERESS 14GA 120MM (NEEDLE) ×4 IMPLANT
NS IRRIG 1000ML POUR BTL (IV SOLUTION) ×4 IMPLANT
OBTURATOR OPTICAL STANDARD 8MM (TROCAR) ×2
OBTURATOR OPTICAL STND 8 DVNC (TROCAR) ×2
OBTURATOR OPTICALSTD 8 DVNC (TROCAR) ×2 IMPLANT
PACK BASIN MAJOR ARMC (MISCELLANEOUS) IMPLANT
PACK LAP CHOLECYSTECTOMY (MISCELLANEOUS) IMPLANT
PAD OB MATERNITY 4.3X12.25 (PERSONAL CARE ITEMS) ×4 IMPLANT
PAD PREP 24X41 OB/GYN DISP (PERSONAL CARE ITEMS) ×4 IMPLANT
PENCIL ELECTRO HAND CTR (MISCELLANEOUS) IMPLANT
PORT ACCESS TROCAR AIRSEAL 5 (TROCAR) ×4 IMPLANT
SET TRI-LUMEN FLTR TB AIRSEAL (TUBING) ×4 IMPLANT
SOLUTION ELECTROLUBE (MISCELLANEOUS) ×4 IMPLANT
SPONGE LAP 18X18 RF (DISPOSABLE) IMPLANT
STAPLER SKIN PROX 35W (STAPLE) IMPLANT
STRIP CLOSURE SKIN 1/2X4 (GAUZE/BANDAGES/DRESSINGS) IMPLANT
SUT CUT NEEDLE DRIVER (INSTRUMENTS)
SUT DVC VLOC 180 0 12IN GS21 (SUTURE) ×4
SUT ETHIBOND CT1 BRD #0 30IN (SUTURE) IMPLANT
SUT ETHIBOND NAB CT1 #1 30IN (SUTURE) IMPLANT
SUT MNCRL 4-0 (SUTURE) ×2
SUT MNCRL 4-0 27XMFL (SUTURE) ×2
SUT VIC AB 0 CT1 27 (SUTURE)
SUT VIC AB 0 CT1 27XCR 8 STRN (SUTURE) IMPLANT
SUT VIC AB 0 CT1 36 (SUTURE) ×4 IMPLANT
SUT VIC AB 0 UR5 27 (SUTURE) IMPLANT
SUT VIC AB 1 CT1 36 (SUTURE) IMPLANT
SUT VIC AB 2-0 CT1 27 (SUTURE)
SUT VIC AB 2-0 CT1 TAPERPNT 27 (SUTURE) IMPLANT
SUT VIC AB 3-0 SH 27 (SUTURE)
SUT VIC AB 3-0 SH 27X BRD (SUTURE) IMPLANT
SUT VIC AB 4-0 PS2 18 (SUTURE) IMPLANT
SUT VICRYL 0 AB UR-6 (SUTURE) IMPLANT
SUTURE DVC VLC 180 0 12IN GS21 (SUTURE) ×2 IMPLANT
SUTURE MNCRL 4-0 27XMF (SUTURE) ×2 IMPLANT
SYR 10ML LL (SYRINGE) ×4 IMPLANT
SYR 30ML LL (SYRINGE) IMPLANT
TRAY FOLEY MTR SLVR 16FR STAT (SET/KITS/TRAYS/PACK) IMPLANT
TROCAR BALLN GELPORT 12X130M (ENDOMECHANICALS) IMPLANT
TROCAR ENDO BLADELESS 11MM (ENDOMECHANICALS) IMPLANT
TUBING EVAC SMOKE HEATED PNEUM (TUBING) IMPLANT

## 2019-07-05 NOTE — Transfer of Care (Signed)
Immediate Anesthesia Transfer of Care Note  Patient: Terry Walters  Procedure(s) Performed: XI ROBOTIC ASSISTED TOTAL HYSTERECTOMY WITH BILATERAL SALPINGECTOMY (Bilateral )  Patient Location: PACU  Anesthesia Type:General  Level of Consciousness: awake, alert , oriented and drowsy  Airway & Oxygen Therapy: Patient Spontanous Breathing and Patient connected to face mask oxygen  Post-op Assessment: Report given to RN and Post -op Vital signs reviewed and stable  Post vital signs: Reviewed and stable  Last Vitals:  Vitals Value Taken Time  BP 127/73 07/05/19 1116  Temp 36.4 C 07/05/19 1115  Pulse 66 07/05/19 1117  Resp 19 07/05/19 1117  SpO2 100 % 07/05/19 1117  Vitals shown include unvalidated device data.  Last Pain:  Vitals:   07/05/19 0713  TempSrc: Tympanic  PainSc: 0-No pain      Patients Stated Pain Goal: 0 ([id number])  Complications: No apparent anesthesia complications

## 2019-07-05 NOTE — Anesthesia Procedure Notes (Signed)
Procedure Name: Intubation Date/Time: 07/05/2019 8:54 AM Performed by: Gentry Fitz, CRNA Pre-anesthesia Checklist: Patient identified, Emergency Drugs available, Suction available, Patient being monitored and Timeout performed Patient Re-evaluated:Patient Re-evaluated prior to induction Oxygen Delivery Method: Circle system utilized Preoxygenation: Pre-oxygenation with 100% oxygen Induction Type: IV induction Ventilation: Mask ventilation without difficulty Laryngoscope Size: Mac and 4 Grade View: Grade I Tube type: Oral Tube size: 7.0 mm Number of attempts: 1 Airway Equipment and Method: Stylet Placement Confirmation: ETT inserted through vocal cords under direct vision and positive ETCO2 Secured at: 21 cm Tube secured with: Tape Dental Injury: Teeth and Oropharynx as per pre-operative assessment

## 2019-07-05 NOTE — Discharge Instructions (Signed)
Discharge instructions:  Call office if you have any of the following: fever >101 F, chills, shortness of breath, excessive vaginal bleeding, incision drainage or problems, leg pain or redness, or any other concerns.   Activity: Do not lift > 15 lbs for 8 weeks.  No intercourse or tampons for 8 weeks.  No driving for 1-2 weeks, or until you are confident you can slam on the brakes.   You may feel some pain in your upper right abdomen/rib and right shoulder.  This is from the gas in the abdomen for surgery. This will subside over time, please be patient!  Take 800mg  Ibuprofen and 1000mg  Tylenol around the clock, every 6 hours for at least the first 3-5 days.  After this you can take as needed.  This will help decrease inflammation and promote healing.  The narcotics you'll take just as needed, as they just trick your brain into thinking its not in pain.    Please don't limit yourself in terms of routine activity.  You will be able to do most things, although they may take longer to do or be a little painful.  You can do it!  Don't be a hero, but don't be a wimp either!

## 2019-07-05 NOTE — Anesthesia Postprocedure Evaluation (Incomplete)
Anesthesia Post Note  Patient: Terry Walters  Procedure(s) Performed: XI ROBOTIC ASSISTED TOTAL HYSTERECTOMY WITH BILATERAL SALPINGECTOMY (Bilateral )  Anesthesia Type: General     Last Vitals:  Vitals:   07/05/19 0713 07/05/19 1115  BP: 123/76 127/73  Pulse: 80 71  Resp: 16 20  Temp: 37.3 C 36.4 C  SpO2: 99% 100%    Last Pain:  Vitals:   07/05/19 0713  TempSrc: Tympanic  PainSc: 0-No pain                 Mayra Brahm Marilynn Rail

## 2019-07-05 NOTE — Op Note (Signed)
  Operative Note   Date 07/05/2019  TIME 10:58 AM  PRE-OP DIAGNOSIS: Stage IA1 squamous cervical cancer s/p CKC; Dysmenorrhea and menorrhagia. Desires definitive surgical management.   POST-OP DIAGNOSIS: Stage IA1 squamous cervical cancer s/p CKC; Desires definitive surgical management.  SURGEON: Larey Days, MD  ASSISTANT:  Angeles Gaetana Michaelis, MD  ANESTHESIA: General  PROCEDURE: Procedure(s): Exam under anesthesia; Robotic assisted total hysterectomy and bilateral salpingectomy.   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 She now desires definitive surgical management.  FINDINGS: Exam under anesthesia revealed a normal size uterus. There were no adnexal masses or nodularity. The parametria was smooth. The cervix was negative for gross lesions. Intraoperative findings included: The uterus and adnexa were normal bilaterally. The upper abdomen was normal including omentum, bowel, liver, stomach, and diaphragmatic surfaces. There was no evidence of grossly enlarged pelvic or right para-aortic lymph nodes.   PROCEDURE IN DETAIL: After informed consent was obtained, the patient was taken to the operating room where anesthesia was obtained without difficulty. The patient was positioned in the dorsal lithotomy position in [place] and her arms were carefully tucked at her sides and the usual precautions were taken.  She was prepped and draped in normal sterile fashion.  Time-out was performed.  A speculum was placed in the vagina and a standard VCare uterine manipulator was then placed in the uterus and suture secured to the cervix without incident.    Operative entry was obtained via a  supraumbilical incision and direct entry. The abdomen insuffulated, and pelvis visualized with noted findings above.  The 10mm robotic ports and LUQ port placement, and the patient was placed in Trendelenburg and the bowel was displaced up into the upper abdomen.  Robotic docking was performed. The robotic vessel sealer and monopolar scissors were placed. Bilateral salpingectomy was performed. Round and utero-ovarian ligaments were sealed and divided.  A bladder flap was created and the bladder was dissected down off the lower uterine segment and cervix. The uterine arteries were skeletonized bilaterally, sealed and divided with the vessel sealer.  A colpotomy was performed circumferentially along the V-Care ring with electrocautery and the cervix was incised from the vagina. The V-care and specimen were removed from the peritoneal cavity and placed in the vagina to maintain the pneumoperitoneum. The vaginal cuff was then closed in a running continuous fashion using 0 V-Lock suture with careful attention to include the vaginal cuff angles and the vaginal mucosa within the closure.    Hemostasis was observed. The intraperitoneal pressure was dropped, and all planes of dissection, vascular pedicles and the vaginal cuff were found to be hemostatic. The skin incisions were closed with subcuticular stitch and surgical glue.  The patient tolerated the procedure well.  Sponge, lap and needle counts were correct x2.  The patient was taken to recovery room in excellent condition.   Antibiotics: Given 1st or 2nd generation cephalosporin, Antibiotics given within 1 hour of the start of the procedure, Antibiotics ordered to be discontinued within 24 hours post procedure   VTE prophylaxis: was ordered perioperatively.  ----- Larey Days, MD, [place] Attending Obstetrician and Gynecologist [facility], Department of [facility]

## 2019-07-05 NOTE — Anesthesia Post-op Follow-up Note (Signed)
Anesthesia QCDR form completed.        

## 2019-07-05 NOTE — Anesthesia Postprocedure Evaluation (Signed)
Anesthesia Post Note  Patient: Terry Walters  Procedure(s) Performed: XI ROBOTIC ASSISTED TOTAL HYSTERECTOMY WITH BILATERAL SALPINGECTOMY (Bilateral )  Patient location during evaluation: PACU Anesthesia Type: General Level of consciousness: awake and alert Pain management: pain level controlled Vital Signs Assessment: post-procedure vital signs reviewed and stable Respiratory status: spontaneous breathing, nonlabored ventilation, respiratory function stable and patient connected to nasal cannula oxygen Cardiovascular status: blood pressure returned to baseline and stable Postop Assessment: no apparent nausea or vomiting Anesthetic complications: no     Last Vitals:  Vitals:   07/05/19 1200 07/05/19 1218  BP: 112/71 127/70  Pulse: 63 74  Resp: 15 16  Temp: (!) 36.3 C (!) 36.2 C  SpO2: 97% 100%    Last Pain:  Vitals:   07/05/19 1218  TempSrc: Temporal  PainSc: 5                  Precious Haws Devine Dant

## 2019-07-05 NOTE — H&P (Addendum)
Preoperative History and Physical  Terry Walters is a 33 y.o. here for surgical management of me history cervical dysplasia/stage I A1 squamous cell status post LEEP as she was fertility desiring at time of diagnosis. 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   Normal PAP and HPV 1/20. Does have menorrhagia and dysmenorrhea and desires completion hysterectomy.   No significant preoperative concerns.  Proposed surgery: robotic assisted total laparoscopic hysterectomy, bilateral salpingectomy, possible oophorectomy, possible laparotomy.  Past Medical History:  Diagnosis Date  . Acid reflux disease    no meds  . Arthritis    knees  . Bipolar 1 disorder ([place])   . Cervical cancer ([place])   . Depression    no meds  . IBS (irritable bowel syndrome)   . Lung nodules   . Panic attack    no meds  . Seizures ([place]) 2008   x2    Past Surgical History:  Procedure Laterality Date  . biospy    . CERVICAL BIOPSY     OB History  No obstetric history on file.  Patient denies any other pertinent gynecologic issues.   No current facility-administered medications on file prior to encounter.    Current Outpatient Medications on File Prior to Encounter  Medication Sig Dispense Refill  . acetaminophen (TYLENOL) 500 MG tablet Take 500-1,000 mg by mouth every 6 (six) hours as needed (pain.).    Marland Kitchen ibuprofen (ADVIL) 200 MG tablet Take 400 mg by mouth every 8 (eight) hours as needed (for pain.).      Allergies  Allergen Reactions  . Dilaudid [Hydromorphone Hcl] Other (See Comments)    Severe headaches  . Morphine And Related Other (See Comments)    Aggressive behavior  . Paxil [Paroxetine Hcl] Other (See Comments)    Hot Flashes and irritable    Social History:   reports that she has been smoking cigarettes. She has a 22.50 pack-year smoking history. She  quit smokeless tobacco use about 14 years ago. She reports previous alcohol use. She reports that she does not use drugs.  Family History  Problem Relation Age of Onset  . COPD Father   . Heart disease Father   . Hypertension Maternal Grandfather   . Heart disease Maternal Grandmother   . Diverticulosis Maternal Grandmother   . Cervical cancer Maternal Grandmother   . Stroke Maternal Aunt   . Cancer Maternal Aunt     Review of Systems: Noncontributory  PHYSICAL EXAM: Blood pressure 123/76, pulse 80, temperature 99.2 F (37.3 C), temperature source Tympanic, resp. rate 16, height 5\' 8"  (1.727 m), weight 83.9 kg, last menstrual period 06/14/2019, SpO2 99 %. General appearance - alert, well appearing, and in no distress Chest - clear to auscultation, no wheezes, rales or rhonchi, symmetric air entry Heart - normal rate and regular rhythm Abdomen - soft, nontender, nondistended, no masses or organomegaly Pelvic - examination not indicated Extremities - peripheral pulses normal, no pedal edema, no clubbing or cyanosis  Labs: Results for orders placed or performed during the hospital encounter of 07/05/19 (from the past 336 hour(s))  Pregnancy, urine POC   Collection Time: 07/05/19  7:22 AM  Result Value Ref Range   Preg Test, Ur NEGATIVE NEGATIVE  ABO/Rh   Collection Time: 07/05/19  7:40 AM  Result Value Ref Range   ABO/RH(D) PENDING   Results for orders placed or performed during the hospital encounter of  06/30/19 (from the past 336 hour(s))  SARS CORONAVIRUS 2 (TAT 6-24 HRS) Nasopharyngeal Nasopharyngeal Swab   Collection Time: 06/30/19  8:33 AM   Specimen: Nasopharyngeal Swab  Result Value Ref Range   SARS Coronavirus 2 NEGATIVE NEGATIVE  Results for orders placed or performed during the hospital encounter of 06/28/19 (from the past 336 hour(s))  CBC WITH DIFFERENTIAL   Collection Time: 06/28/19  9:12 AM  Result Value Ref Range   WBC 13.7 (H) 4.0 - 10.5 K/uL   RBC 4.89  3.87 - 5.11 MIL/uL   Hemoglobin 14.4 12.0 - 15.0 g/dL   HCT 43.2 36.0 - 46.0 %   MCV 88.3 80.0 - 100.0 fL   MCH 29.4 26.0 - 34.0 pg   MCHC 33.3 30.0 - 36.0 g/dL   RDW 11.7 11.5 - 15.5 %   Platelets 255 150 - 400 K/uL   nRBC 0.0 0.0 - 0.2 %   Neutrophils Relative % 81 %   Neutro Abs 11.0 (H) 1.7 - 7.7 K/uL   Lymphocytes Relative 13 %   Lymphs Abs 1.8 0.7 - 4.0 K/uL   Monocytes Relative 4 %   Monocytes Absolute 0.6 0.1 - 1.0 K/uL   Eosinophils Relative 2 %   Eosinophils Absolute 0.2 0.0 - 0.5 K/uL   Basophils Relative 0 %   Basophils Absolute 0.1 0.0 - 0.1 K/uL   Immature Granulocytes 0 %   Abs Immature Granulocytes 0.06 0.00 - 0.07 K/uL  Comprehensive metabolic panel   Collection Time: 06/28/19  9:12 AM  Result Value Ref Range   Sodium 136 135 - 145 mmol/L   Potassium 3.8 3.5 - 5.1 mmol/L   Chloride 106 98 - 111 mmol/L   CO2 21 (L) 22 - 32 mmol/L   Glucose, Bld 118 (H) 70 - 99 mg/dL   BUN 14 6 - 20 mg/dL   Creatinine, Ser 0.57 0.44 - 1.00 mg/dL   Calcium 8.9 8.9 - 10.3 mg/dL   Total Protein 7.5 6.5 - 8.1 g/dL   Albumin 4.1 3.5 - 5.0 g/dL   AST 28 15 - 41 U/L   ALT 35 0 - 44 U/L   Alkaline Phosphatase 78 38 - 126 U/L   Total Bilirubin 0.5 0.3 - 1.2 mg/dL   GFR calc non Af Amer >60 >60 mL/min   GFR calc Af Amer >60 >60 mL/min   Anion gap 9 5 - 15  Urinalysis, Routine w reflex microscopic   Collection Time: 06/28/19  9:12 AM  Result Value Ref Range   Color, Urine YELLOW (A) YELLOW   APPearance TURBID (A) CLEAR   Specific Gravity, Urine 1.023 1.005 - 1.030   pH 5.0 5.0 - 8.0   Glucose, UA NEGATIVE NEGATIVE mg/dL   Hgb urine dipstick MODERATE (A) NEGATIVE   Bilirubin Urine NEGATIVE NEGATIVE   Ketones, ur NEGATIVE NEGATIVE mg/dL   Protein, ur NEGATIVE NEGATIVE mg/dL   Nitrite NEGATIVE NEGATIVE   Leukocytes,Ua NEGATIVE NEGATIVE   RBC / HPF 21-50 0 - 5 RBC/hpf   WBC, UA 0-5 0 - 5 WBC/hpf   Bacteria, UA RARE (A) NONE SEEN   Squamous Epithelial / LPF 0-5 0 - 5   Mucus  PRESENT   Type and screen   Collection Time: 06/28/19  9:12 AM  Result Value Ref Range   ABO/RH(D) A POS    Antibody Screen NEG    Sample Expiration 07/12/2019,2359    Extend sample reason      NO TRANSFUSIONS OR PREGNANCY IN  THE PAST 3 MONTHS Performed at [facility], [address] [place], [place] [address]     Imaging Studies: No results found.  Assessment: Patient Active Problem List   Diagnosis Date Noted  . Dysmenorrhea 04/04/2019  . History of cervical cancer 04/04/2019  . Microscopic hematuria 08/05/2017  . Abdominal pain 11/03/2016  . Diarrhea 10/21/2016  . Diarrhea of presumed infectious origin 10/21/2016  . Tobacco use disorder 05/13/2016  . Dysuria 05/15/2015  . Cervical intraepithelial neoplasia grade III with severe dysplasia 01/23/2015    Plan: Patient will undergo surgical management with robotic assisted total laparoscopic hysterectomy, bilateral salpingectomy, possible oophorectomy, possible laparotomy.   The risks of surgery were discussed in detail with the patient including but not limited to: bleeding which may require transfusion or reoperation; infection which may require antibiotics; injury to surrounding organs which may involve bowel, bladder, ureters ; need for additional procedures including laparoscopy or laparotomy; thromboembolic phenomenon, surgical site problems and other postoperative/anesthesia complications. Likelihood of success in alleviating the patient's condition was discussed. Routine postoperative instructions will be reviewed with the patient and her family in detail after surgery.  The patient concurred with the proposed plan, giving informed written consent for the surgery.  Patient has been NPO since last night she will remain NPO for procedure.  Anesthesia and OR aware.  Preoperative prophylactic antibiotics and SCDs ordered on call to the OR.  To OR when ready.  ----- Larey Days, MD, [place] Attending Obstetrician and  Gynecologist [facility], Department of [facility]

## 2019-07-05 NOTE — Anesthesia Preprocedure Evaluation (Signed)
Anesthesia Evaluation  Patient identified by MRN, date of birth, ID band Patient awake    Reviewed: Allergy & Precautions, H&P , NPO status , Patient's Chart, lab work & pertinent test results  History of Anesthesia Complications Negative for: history of anesthetic complications  Airway Mallampati: III  TM Distance: >3 FB Neck ROM: full    Dental  (+) Chipped, Poor Dentition   Pulmonary neg shortness of breath, Current Smoker and Patient abstained from smoking.,           Cardiovascular Exercise Tolerance: Good (-) angina(-) Past MI and (-) DOE negative cardio ROS       Neuro/Psych Seizures -,  PSYCHIATRIC DISORDERS negative psych ROS   GI/Hepatic Neg liver ROS, GERD  Medicated and Controlled,  Endo/Other  negative endocrine ROS  Renal/GU      Musculoskeletal   Abdominal   Peds  Hematology negative hematology ROS (+)   Anesthesia Other Findings Past Medical History: No date: Acid reflux disease     Comment:  no meds No date: Arthritis     Comment:  knees No date: Bipolar 1 disorder (HCC) No date: Cervical cancer (H[facility]) No date: Depression     Comment:  no meds No date: IBS (irritable bowel syndrome) No date: Lung nodules No date: Panic attack     Comment:  no meds 2008: Seizures ([place])     Comment:  x2   Past Surgical History: No date: biospy No date: CERVICAL BIOPSY  BMI    Body Mass Index: 28.13 kg/m      Reproductive/Obstetrics negative OB ROS                             Anesthesia Physical Anesthesia Plan  ASA: III  Anesthesia Plan: General ETT   Post-op Pain Management:    Induction: Intravenous  PONV Risk Score and Plan: Ondansetron, Dexamethasone, Midazolam and Treatment may vary due to age or medical condition  Airway Management Planned: Oral ETT  Additional Equipment:   Intra-op Plan:   Post-operative Plan: Extubation in OR  Informed Consent: I  have reviewed the patients History and Physical, chart, labs and discussed the procedure including the risks, benefits and alternatives for the proposed anesthesia with the patient or authorized representative who has indicated his/her understanding and acceptance.     Dental Advisory Given  Plan Discussed with: Anesthesiologist, CRNA and Surgeon  Anesthesia Plan Comments: (Patient consented for risks of anesthesia including but not limited to:  - adverse reactions to medications - damage to teeth, lips or other oral mucosa - sore throat or hoarseness - Damage to heart, brain, lungs or loss of life  Patient voiced understanding.)        Anesthesia Quick Evaluation

## 2019-07-06 ENCOUNTER — Encounter: Payer: Self-pay | Admitting: Obstetrics & Gynecology

## 2019-07-06 LAB — SURGICAL PATHOLOGY

## 2019-08-16 ENCOUNTER — Other Ambulatory Visit: Payer: Self-pay

## 2019-08-16 ENCOUNTER — Encounter: Payer: Self-pay | Admitting: Obstetrics and Gynecology

## 2019-08-16 ENCOUNTER — Inpatient Hospital Stay: Payer: Medicaid Other | Attending: Obstetrics and Gynecology | Admitting: Obstetrics and Gynecology

## 2019-08-16 VITALS — BP 131/71 | HR 98 | Temp 97.6°F | Wt 190.4 lb

## 2019-08-16 DIAGNOSIS — Z90710 Acquired absence of both cervix and uterus: Secondary | ICD-10-CM

## 2019-08-16 DIAGNOSIS — Z8541 Personal history of malignant neoplasm of cervix uteri: Secondary | ICD-10-CM

## 2019-08-16 DIAGNOSIS — Z90722 Acquired absence of ovaries, bilateral: Secondary | ICD-10-CM

## 2019-08-16 NOTE — Progress Notes (Signed)
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 female with history cervical dysplasia/stage I A1 squamous cell status post LEEP as she was fertility desiring at time of diagnosis.  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 However, she opted to proceed with definitive surgery in 06/2019.   On 07/05/2019 she underwent Exam under anesthesia; Robotic assisted total hysterectomy and bilateral salpingectomy. Pathology negative for residual cancer.   DIAGNOSIS:  A. UTERUS WITH CERVIX AND BILATERAL FALLOPIAN TUBES; TOTAL HYSTERECTOMY  WITH BILATERAL SALPINGECTOMY:  - CHRONIC CERVICITIS WITH CILIATED AND SQUAMOUS METAPLASIA, AND FIBROUS  SCARRING CONSISTENT WITH PRIOR SURGICAL PROCEDURE.  - SECRETORY ENDOMETRIUM.  - UNREMARKABLE MYOMETRIUM.  - RIGHT FALLOPIAN TUBE WITH BENIGN PARATUBAL CYST AND FIMBRIATED END.  - LEFT FALLOPIAN TUBE WITH FIMBRIATED END.  - NEGATIVE FOR ATYPIA AND MALIGNANCY.   Today, she feels well and feels she is recovering from surgery without complications. No drainage or discharge. No pain. She has not had a recent bowel movement. She is not currently sexually active.        History:   This G1 P1 patient was seen by Dr Janice Norrie for evaluation of a PAP 04/25/14 showing ASCUS and HPV HR+. She also had heavy menstrual periods, some bleeding in between and inability to conceive. Her husband has a low sperm count due to chemotherapy for recurrent Hodgkins disease.   Dr. Laymond Purser in [place] performed a colposcopy in 10/15.  A large mosaic lesion was noted on the anterior cervix and biopsy showed high-grade squamous intraepithelial lesion.  A LEEP was planned and the  patient also requested a tubal ligation.  After discussion of various contraceptive methods the patient elected to have a Mirena IUD inserted as it would help with her menorrhagia and would be reversible.   On September 25, 2014 she underwent LEEP, hysteroscopy and Mirena IUD insertion. The LEEP was done in three pieces (anterior, posterior, endocervix). 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  Margins were positive and there was endocervical gland involvement.  This pathology was reviewed and [facility] by Dr Alisia Ferrari.  Endometrium was proliferative.  She underwent cone biopsy at [facility] with Dr Fransisca Connors on 11/21/14. Mirena IUD was removed at that time.     A. ECTOCERVIX; CONIZATION:  - HIGH-GRADE SQUAMOUS INTRAEPITHELIAL LESION (CIN 2-3).  - AN ECTOCERVICAL MARGIN IS INVOLVED IN THE 9 TO 12:00 QUADRANT.  - THE ENDOCERVICAL MARGINS OF EXCISION ARE NEGATIVE   B. ENDOCERVIX; CONIZATION:  - BENIGN FIBROUS TISSUE.   9/16 PAP normal and HR HPV was negative.  11/13/2015 Pap negative; HRHPV negative.  3/18 Normal PAP/HPV.  3/18 Pelvic US  and CT scan to work up pain basically normal.  CT scan abd/pelvis IMPRESSION: No acute findings within the abdomen or pelvis.  4/18 Chest CT 6 mm pulmonary nodule in inferior right middle lobe. Non-contrast chest CT at 6-12 months is recommended. If the nodule is stable at time of repeat CT, then future CT at 18-24 months (from today's scan) is considered optional for low-risk patients, but is recommended for high-risk patients. This recommendation follows the consensus  statement: Guidelines for Management of Incidental Pulmonary Nodules Detected on CT Images: From the Fleischner Society 2017; Radiology 2017; 284:228-243.   She has a history of tobacco use, a learning disorder, seizures and significant bipolar  mood disorder and is taking Celexa. She has a history of depression with hospitalization and suicide attempt.  Her husband having Non-Hodgkin's Lymphoma and is sterile.  06/2017-normal Pap/HPV.  At that time she reported UTI-like symptoms.  UA was negative for infection but noted to have persistent microhematuria since 2016.  She was referred to urology and saw Dr. Bernardo Heater on 08/05/2017.  Given previous negative imaging, cystoscopy was recommended for further evaluation.  She has not followed up.  08/04/17-CT chest without contrast for reassessment of pulmonary nodule 1. Stable right middle lobe subpleural nodule measuring 6 mm compared to 11/17/2016. 2. There is a 3 mm nodule in the right upper lobe.  Not imaged on previous studies.  Noncontrast chest CT can be considered in 12 months if patient is high risk.  This recommendation follows the consensus statement: Guidelines for management of incidental pulmonary nodules detected on CT images: From the Fleischner Society 2017; radiology 2017; 284: 228-243.   She was seen in ER on 09/22/17 nonproductive cough, body aches, fever, and dysuria. Diagnosed with bronchitis and UTI, although Culture suggested re-collection. She was treated with keflex and azithromycin and directed to follow-up with PCP.    1/20 Normal PAP and HPV   Incidental pulmonary nodules seen on imaging have been followed with below result:  07/29/18- CT Chest WO Contrast IMPRESSION: 1. Stable small pulmonary nodules from 08/04/2017, consistent with benign findings. No additional follow-up necessary per consensus guidelines. This recommendation follows the consensus statement: 2. No acute findings.  She has a history of persistent microscopic hematuria and had seen Dr. Bernardo Heater who recommended cystoscopy.    She continues to smoke 1/2-1 pack/day of cigarettes.  Problem List: Patient Active Problem List   Diagnosis Date Noted  . Dysmenorrhea 04/04/2019  . History of cervical cancer  04/04/2019  . Microscopic hematuria 08/05/2017  . Abdominal pain 11/03/2016  . Diarrhea 10/21/2016  . Diarrhea of presumed infectious origin 10/21/2016  . Tobacco use disorder 05/13/2016  . Dysuria 05/15/2015  . Cervical intraepithelial neoplasia grade III with severe dysplasia 01/23/2015    Past Medical History: Past Medical History:  Diagnosis Date  . Acid reflux disease    no meds  . Arthritis    knees  . Bipolar 1 disorder ([place])   . Cervical cancer ([place])   . Depression    no meds  . IBS (irritable bowel syndrome)   . Lung nodules   . Panic attack    no meds  . Seizures ([place]) 2008   x2     Past Surgical History: Past Surgical History:  Procedure Laterality Date  . biospy    . CERVICAL BIOPSY    . ROBOTIC ASSISTED TOTAL HYSTERECTOMY WITH BILATERAL SALPINGO OOPHERECTOMY Bilateral 07/05/2019   Procedure: XI ROBOTIC ASSISTED TOTAL HYSTERECTOMY WITH BILATERAL SALPINGECTOMY;  Surgeon: Ward, Honor Loh, MD;  Location: [facility] ORS;  Service: Gynecology;  Laterality: Bilateral;    Family History: Family History  Problem Relation Walters of Onset  . COPD Father   . Heart disease Father   . Hypertension Maternal Grandfather   . Heart disease Maternal Grandmother   . Diverticulosis Maternal Grandmother   . Cervical cancer Maternal Grandmother   . Stroke Maternal Aunt   . Cancer Maternal Aunt     Social History:  Social History   Socioeconomic History  . Marital status: Married    Spouse name: Not on file  . Number of children: Not on file  . Years of education: Not on file  . Highest education level: Not on file  Occupational History  . Not on file  Tobacco Use  . Smoking status: Current Every Day Smoker    Packs/day: 0.50    Years: 15.00    Pack years: 7.50    Types: Cigarettes  . Smokeless tobacco: Former Systems developer    Quit date: 01/22/2005  Substance and Sexual Activity  . Alcohol use: Not Currently    Comment: occasional beer rare about 5 years ago  . Drug use: No   . Sexual activity: Yes  Other Topics Concern  . Not on file  Social History Narrative  . Not on file   Social Determinants of Health   Financial Resource Strain:   . Difficulty of Paying Living Expenses: Not on file  Food Insecurity:   . Worried About Charity fundraiser in the Last Year: Not on file  . Ran Out of Food in the Last Year: Not on file  Transportation Needs:   . Lack of Transportation (Medical): Not on file  . Lack of Transportation (Non-Medical): Not on file  Physical Activity:   . Days of Exercise per Week: Not on file  . Minutes of Exercise per Session: Not on file  Stress:   . Feeling of Stress : Not on file  Social Connections:   . Frequency of Communication with Friends and Family: Not on file  . Frequency of Social Gatherings with Friends and Family: Not on file  . Attends Religious Services: Not on file  . Active Member of Clubs or Organizations: Not on file  . Attends Archivist Meetings: Not on file  . Marital Status: Not on file  Intimate Partner Violence:   . Fear of Current or Ex-Partner: Not on file  . Emotionally Abused: Not on file  . Physically Abused: Not on file  . Sexually Abused: Not on file    Allergies: Allergies  Allergen Reactions  . Dilaudid [Hydromorphone Hcl] Other (See Comments)    Severe headaches  . Morphine And Related Other (See Comments)    Aggressive behavior  . Paxil [Paroxetine Hcl] Other (See Comments)    Hot Flashes and irritable    Review of Systems General:  no complaints Skin: no complaints Eyes: no complaints HEENT: no complaints Breasts: no complaints Pulmonary: chronic cough- smokes; unchanged Cardiac: no complaints Gastrointestinal: no complaints Genitourinary/Sexual: chronic incontinence 'dribble' with sneezing or coughing (no worse) Ob/Gyn: no complaints Musculoskeletal: chronic back pain/low back Hematology: no complaints Neurologic/Psych: no complaints   Objective:  Physical  Examination:  BP 131/71 (BP Location: Left Arm, Patient Position: Sitting)   Pulse 98   Temp 97.6 F (36.4 C) (Tympanic)   Wt 190 lb 6.4 oz (86.4 kg)   BMI 28.95 kg/m   GENERAL: Patient is a well appearing female in no acute distress HEENT:  Sclera clear. Anicteric LUNGS:  Clear to auscultation bilaterally ABDOMEN:  Soft, nontender.  No hernias, incisions well healed. No masses or ascites EXTREMITIES:  No peripheral edema. Atraumatic. No cyanosis NEURO:  Nonfocal. Well oriented.  Appropriate affect.  Pelvic: EGBUS: skin tag left vulva 2 o'clock. No other lesions Cervix: surgically absent Vagina: no lesions, no discharge. Unable to see vaginal cuff or sutures. Large stool burden in rectum. Uterus: surgically absent Adnexa: no palpable  masses Rectovaginal: deferred.    Assessment:  Terry Walters is a 33 y.o. female with a history of Stage 1A1 squamous cell carcinoma of the cervix on LEEP in 2/16.  Cone biopsy 4/16 with CIN III with positive ectocervical margin from 9-12 o'clock. She did not wish to proceed with with hysterectomy unless it is absolutely needed.  Although she had a positive ectocervical margin, it is possible that all the CIN III was all removed. Normal PAP and HR HPV in 9/16, 3/17, 3/18, 11/18, 5/19, 1/20. Opted for definitive surgical management s/p RA TLH/BS with negative pathology.   Repeat CT Chest in 07/2018 to follow-up on lung nodules was normal and additional follow up scans not needed.   Urinary incontinence, and chronic hematuria.   Medical comorbidities: Tobacco usage, psychiatric history, learning disability  Plan:   Problem List Items Addressed This Visit      Other   History of cervical cancer - Primary     Continue to follow up in one year with Dr. Fransisca Connors. After that visit she can be released from Gynecologic Oncology.   She is scheduled to see Dr. Leonides Schanz in January for a postoperative visit. I recommended she try to have a bowel movement the day  before or of that visit. Hopefully Dr. Leonides Schanz will be able to better visualize the vaginal cuff.   She is not currently sexually active and recommended she wait 12 weeks after surgery.   Follow up with urology regarding her urologic complaints. We have tried to refer to Urology previously. We can offer referral again in the future.   I personally interviewed and examined the patient. Agreed with the above/below plan of care. Patient/family questions were answered.   Terry Au, NP  I personally had a face to face interaction and evaluated the patient jointly with the NP, Ms. Terry Walters.  I have reviewed her history and available records and have performed the key portions of the physical exam including general, abdominal exam, pelvic exam with my findings confirming those documented above by the APP.  I have discussed the case with the APP and the patient.  I agree with the above documentation, assessment and plan which was fully formulated by me.  Counseling was completed by me.   I personally saw the patient and performed a substantive portion of this encounter in conjunction with the listed APP as documented above.  Terry Hitchman Gaetana Michaelis, MD

## 2019-08-16 NOTE — Progress Notes (Signed)
Pt has issues that she feels uncomfortable in her privates from her stitches.

## 2019-11-22 ENCOUNTER — Ambulatory Visit: Payer: Medicaid Other

## 2020-04-10 ENCOUNTER — Other Ambulatory Visit: Payer: Self-pay

## 2020-04-10 ENCOUNTER — Telehealth (INDEPENDENT_AMBULATORY_CARE_PROVIDER_SITE_OTHER): Payer: Medicaid Other | Admitting: Psychiatry

## 2020-04-10 ENCOUNTER — Telehealth: Payer: Self-pay

## 2020-04-10 ENCOUNTER — Encounter: Payer: Self-pay | Admitting: Psychiatry

## 2020-04-10 DIAGNOSIS — Z9189 Other specified personal risk factors, not elsewhere classified: Secondary | ICD-10-CM

## 2020-04-10 DIAGNOSIS — F319 Bipolar disorder, unspecified: Secondary | ICD-10-CM | POA: Diagnosis not present

## 2020-04-10 DIAGNOSIS — Z79899 Other long term (current) drug therapy: Secondary | ICD-10-CM | POA: Diagnosis not present

## 2020-04-10 DIAGNOSIS — F17200 Nicotine dependence, unspecified, uncomplicated: Secondary | ICD-10-CM

## 2020-04-10 DIAGNOSIS — R4183 Borderline intellectual functioning: Secondary | ICD-10-CM | POA: Insufficient documentation

## 2020-04-10 DIAGNOSIS — F3160 Bipolar disorder, current episode mixed, unspecified: Secondary | ICD-10-CM | POA: Insufficient documentation

## 2020-04-10 MED ORDER — RISPERIDONE 0.5 MG PO TBDP: 0.5000 mg | ORAL_TABLET | Freq: Every day | ORAL | 1 refills | Status: AC

## 2020-04-10 NOTE — Telephone Encounter (Signed)
labwork orders faxed and confirmed to [facility]
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
## 2020-04-11 ENCOUNTER — Ambulatory Visit: Payer: Medicaid Other | Admitting: Licensed Clinical Social Worker

## 2020-05-17 ENCOUNTER — Telehealth (INDEPENDENT_AMBULATORY_CARE_PROVIDER_SITE_OTHER): Payer: Medicaid Other | Admitting: Psychiatry

## 2020-05-17 ENCOUNTER — Other Ambulatory Visit: Payer: Self-pay

## 2020-05-17 DIAGNOSIS — Z5329 Procedure and treatment not carried out because of patient's decision for other reasons: Secondary | ICD-10-CM

## 2020-05-17 NOTE — Progress Notes (Signed)
No response to call or text or video invite  

## 2020-08-21 ENCOUNTER — Telehealth: Payer: Self-pay | Admitting: Obstetrics and Gynecology

## 2020-08-21 ENCOUNTER — Inpatient Hospital Stay: Payer: Medicaid Other | Attending: Obstetrics and Gynecology

## 2020-08-21 NOTE — Telephone Encounter (Signed)
Call placed to patient in regards to missed appointment 08/21/20. Unable to reach patient, no VM available.

## 2020-09-11 ENCOUNTER — Inpatient Hospital Stay: Payer: Medicaid Other | Attending: Obstetrics and Gynecology | Admitting: Obstetrics and Gynecology

## 2020-09-11 VITALS — BP 119/73 | HR 89 | Temp 98.7°F | Resp 20 | Wt 176.8 lb

## 2020-09-11 DIAGNOSIS — Z9151 Personal history of suicidal behavior: Secondary | ICD-10-CM | POA: Diagnosis not present

## 2020-09-11 DIAGNOSIS — Z90710 Acquired absence of both cervix and uterus: Secondary | ICD-10-CM | POA: Insufficient documentation

## 2020-09-11 DIAGNOSIS — F17210 Nicotine dependence, cigarettes, uncomplicated: Secondary | ICD-10-CM | POA: Insufficient documentation

## 2020-09-11 DIAGNOSIS — R911 Solitary pulmonary nodule: Secondary | ICD-10-CM | POA: Diagnosis not present

## 2020-09-11 DIAGNOSIS — F319 Bipolar disorder, unspecified: Secondary | ICD-10-CM | POA: Insufficient documentation

## 2020-09-11 DIAGNOSIS — F819 Developmental disorder of scholastic skills, unspecified: Secondary | ICD-10-CM | POA: Diagnosis not present

## 2020-09-11 DIAGNOSIS — Z90722 Acquired absence of ovaries, bilateral: Secondary | ICD-10-CM | POA: Diagnosis not present

## 2020-09-11 DIAGNOSIS — D069 Carcinoma in situ of cervix, unspecified: Secondary | ICD-10-CM | POA: Insufficient documentation

## 2020-09-11 NOTE — Progress Notes (Signed)
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 female with history cervical dysplasia/stage I A1 squamous cell status post LEEP as she was fertility desiring at time of diagnosis.  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 However, she opted to proceed with definitive surgery in 06/2019 with no residual CIN or cancer.   Today, she feels well. No drainage or discharge. No pain. Has not gotten the COVID vaccination or flu shot.        History:   This G1 P1 patient was seen by Dr Janice Norrie for evaluation of a PAP 04/25/14 showing ASCUS and HPV HR+. She also had heavy menstrual periods, some bleeding in between and inability to conceive. Her husband has a low sperm count due to chemotherapy for recurrent Hodgkins disease.   Dr. Laymond Purser in [place] performed a colposcopy in 10/15.  A large mosaic lesion was noted on the anterior cervix and biopsy showed high-grade squamous intraepithelial lesion.  A LEEP was planned and the patient also requested a tubal ligation.  After discussion of various contraceptive methods the patient elected to have a Mirena IUD inserted as it would help with her menorrhagia and would be reversible.   On September 25, 2014 she underwent LEEP, hysteroscopy and Mirena IUD insertion. The LEEP was done in three pieces (anterior, posterior, endocervix). 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  Margins were positive and there was endocervical gland involvement.  This pathology was reviewed and confirmed at the [facility] by Dr Alisia Ferrari.  Endometrium was proliferative.  She underwent cone biopsy at [facility] with Dr Fransisca Connors on 11/21/14. Mirena IUD was removed at that time.     A. ECTOCERVIX; CONIZATION:  - HIGH-GRADE SQUAMOUS INTRAEPITHELIAL LESION (CIN 2-3).  - AN ECTOCERVICAL MARGIN IS INVOLVED IN THE 9 TO 12:00 QUADRANT.  - THE ENDOCERVICAL MARGINS OF EXCISION ARE NEGATIVE   B. ENDOCERVIX; CONIZATION:  - BENIGN FIBROUS TISSUE.   9/16 PAP normal and HR HPV was negative.  11/13/2015 Pap negative; HRHPV negative.  3/18 Normal PAP/HPV.  3/18 Pelvic US  and CT scan to work up pain basically normal.  CT scan abd/pelvis IMPRESSION: No acute findings within the abdomen or pelvis.  4/18 Chest CT 6 mm pulmonary nodule in inferior right middle lobe. Non-contrast chest CT at 6-12 months is recommended. If the nodule is stable at time of repeat CT, then future CT at 18-24 months (from today's scan) is considered optional for low-risk patients, but is recommended for high-risk patients. This recommendation follows the consensus statement: Guidelines for Management of Incidental Pulmonary Nodules Detected on CT Images: From the Fleischner Society 2017; Radiology 2017; 284:228-243.   She has a history of tobacco use, a learning disorder, seizures and significant bipolar mood disorder and is taking Celexa. She has a history of depression with hospitalization and suicide attempt.  Her husband having Non-Hodgkin's Lymphoma and is sterile.  06/2017-normal Pap/HPV.  At that time she reported UTI-like symptoms.  UA was negative for infection but noted to have persistent microhematuria since 2016.  She was referred to urology and saw Dr. Bernardo Heater on 08/05/2017.  Given previous negative imaging, cystoscopy was recommended for further evaluation.  She has not followed up.  08/04/17-CT chest without contrast for reassessment of pulmonary nodule 1. Stable right middle lobe subpleural nodule measuring 6 mm compared to 11/17/2016. 2. There is a  3 mm nodule in the right upper lobe.  Not imaged on previous studies.  Noncontrast chest CT can be considered in 12 months if patient is high risk.  This recommendation follows the consensus statement: Guidelines for management of incidental pulmonary nodules detected on CT images: From the Fleischner Society 2017; radiology 2017; 284: 228-243.   She was seen in ER on 09/22/17 nonproductive cough, body aches, fever, and dysuria. Diagnosed with bronchitis and UTI, although Culture suggested re-collection. She was treated with keflex and azithromycin and directed to follow-up with PCP.    1/20 Normal PAP and HPV   Incidental pulmonary nodules seen on imaging have been followed with below result:  07/29/18- CT Chest WO Contrast IMPRESSION: 1. Stable small pulmonary nodules from 08/04/2017, consistent with benign findings. No additional follow-up necessary per consensus guidelines. This recommendation follows the consensus statement: 2. No acute findings.  On 07/05/2019 she underwent Exam under anesthesia; Robotic assisted total hysterectomy and bilateral salpingectomy. Pathology negative for residual cancer.   DIAGNOSIS:  A. UTERUS WITH CERVIX AND BILATERAL FALLOPIAN TUBES; TOTAL HYSTERECTOMY  WITH BILATERAL SALPINGECTOMY:  - CHRONIC CERVICITIS WITH CILIATED AND SQUAMOUS METAPLASIA, AND FIBROUS  SCARRING CONSISTENT WITH PRIOR SURGICAL PROCEDURE.  - SECRETORY ENDOMETRIUM.  - UNREMARKABLE MYOMETRIUM.  - RIGHT FALLOPIAN TUBE WITH BENIGN PARATUBAL CYST AND FIMBRIATED END.  - LEFT FALLOPIAN TUBE WITH FIMBRIATED END.  - NEGATIVE FOR ATYPIA AND MALIGNANCY.   She has a history of persistent microscopic hematuria and had seen Dr. Bernardo Heater who recommended cystoscopy.    She continues to smoke 1/2-1 pack/day of cigarettes.  Problem List: Patient Active Problem List   Diagnosis Date Noted  . At risk for long QT syndrome 04/10/2020  . Bipolar affective disorder, current episode mixed ([place]) 04/10/2020   . High risk medication use 04/10/2020  . Borderline intellectual functioning 04/10/2020  . Dysmenorrhea 04/04/2019  . History of cervical cancer 04/04/2019  . Microscopic hematuria 08/05/2017  . Abdominal pain 11/03/2016  . Diarrhea 10/21/2016  . Diarrhea of presumed infectious origin 10/21/2016  . Tobacco use disorder 05/13/2016  . Dysuria 05/15/2015  . Cervical intraepithelial neoplasia grade III with severe dysplasia 01/23/2015    Past Medical History: Past Medical History:  Diagnosis Date  . Acid reflux disease    no meds  . Arthritis    knees  . Bipolar 1 disorder ([place])   . Cervical cancer ([place])   . Depression    no meds  . 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 IBS (irritable bowel syndrome)   . Lung nodules   . Panic attack    no meds  . Seizures ([place]) 2008   x2     Past Surgical History: Past Surgical History:  Procedure Laterality Date  . biospy    . CERVICAL BIOPSY    . ROBOTIC ASSISTED TOTAL HYSTERECTOMY WITH BILATERAL SALPINGO OOPHERECTOMY Bilateral 07/05/2019   Procedure: XI ROBOTIC ASSISTED TOTAL HYSTERECTOMY WITH BILATERAL SALPINGECTOMY;  Surgeon: Ward, Honor Loh, MD;  Location: [facility] ORS;  Service: Gynecology;  Laterality: Bilateral;    Family History: Family History  Problem Relation Age of Onset  . COPD Father   . Heart disease Father   . Alcohol abuse Father   . Hypertension Maternal Grandfather   .  Heart disease Maternal Grandmother   . Diverticulosis Maternal Grandmother   . Cervical cancer Maternal Grandmother   . Stroke Maternal Aunt   . Cancer Maternal Aunt   . Mental illness Mother     Social History: Social History   Socioeconomic History  . Marital status: Married    Spouse name: Not on file  . Number of children: 1  . Years of education: HIGH SCHOOL  . Highest education level: Not on file  Occupational History  . Not on file  Tobacco Use  . Smoking status: Current Every Day Smoker    Packs/day: 1.00    Years: 16.00     Pack years: 16.00    Types: Cigarettes  . Smokeless tobacco: Never Used  Vaping Use  . Vaping Use: Never used  Substance and Sexual Activity  . Alcohol use: Not Currently    Comment: occasional beer rare about 5 years ago  . Drug use: No  . Sexual activity: Yes  Other Topics Concern  . Not on file  Social History Narrative  . Not on file   Social Determinants of Health   Financial Resource Strain: Not on file  Food Insecurity: Not on file  Transportation Needs: Not on file  Physical Activity: Not on file  Stress: Not on file  Social Connections: Not on file  Intimate Partner Violence: Not on file    Allergies: Allergies  Allergen Reactions  . Dilaudid [Hydromorphone Hcl] Other (See Comments)    Severe headaches  . Morphine And Related Other (See Comments)    Aggressive behavior  . Paxil [Paroxetine Hcl] Other (See Comments)    Hot Flashes and irritable    Review of Systems General:  no complaints Skin: no complaints Eyes: no complaints HEENT: no complaints Breasts: no complaints Pulmonary: chronic cough- smokes; unchanged Cardiac: no complaints Gastrointestinal: no complaints Genitourinary/Sexual: chronic incontinence 'dribble' with sneezing or coughing (no worse) Ob/Gyn: no complaints Musculoskeletal: chronic back pain/low back Hematology: no complaints Neurologic/Psych: no complaints  Objective:  Physical Examination:  BP 119/73   Pulse 89   Temp 98.7 F (37.1 C)   Resp 20   Wt 176 lb 12.8 oz (80.2 kg)   SpO2 98%   BMI 26.88 kg/m   GENERAL: Patient is a well appearing female in no acute distress HEENT:  Sclera clear. Anicteric LUNGS:  Clear to auscultation bilaterally ABDOMEN:  Soft, nontender.  No hernias, incisions well healed. No masses or ascites EXTREMITIES:  No peripheral edema. Atraumatic. No cyanosis NEURO:  Nonfocal. Well oriented.  Appropriate affect.  Pelvic: EGBUS: skin tag left vulva 2 o'clock. No other lesions Cervix:  surgically absent Vagina: no lesions, no discharge.   Uterus: surgically absent Adnexa: no palpable masses Rectovaginal: deferred.    Assessment:  Terry Walters is a 35 y.o. female with a history of Stage 1A1 squamous cell carcinoma of the cervix on LEEP in 2/16.  Cone biopsy 4/16 with CIN III with positive ectocervical margin from 9-12 o'clock. She did not wish to proceed with with hysterectomy unless it is absolutely needed.  Although she had a positive ectocervical margin, it is possible that all the CIN III was all removed. Normal PAP and HR HPV in 9/16, 3/17, 3/18, 11/18, 5/19, 1/20. Opted for definitive surgical management s/p RA TLH/BS with negative pathology 11/20.  Normal exam today.   Repeat CT Chest in 07/2018 to follow-up on lung nodules was normal and additional follow up scans not needed.   Urinary incontinence, and  chronic hematuria.   Medical comorbidities: Tobacco usage, psychiatric history, learning disability  Plan:   Problem List Items Addressed This Visit      Genitourinary   Cervical intraepithelial neoplasia grade III with severe dysplasia - Primary     PAP/HPV done today.  Will follow up in one year with Dr. Leonides Schanz, since it has been 5 years since her microinvasive cancer of the cervix. She can be released from Gynecologic Oncology clinic.   I personally interviewed and examined the patient. Agreed with the above/below plan of care. Patient/family questions were answered.   Mellody Drown, MD

## 2020-09-11 NOTE — Progress Notes (Signed)
Patient states she just finished Amoxacillin for a UTI. (7DAYS) Patients states the last 3-4 weeks she has been spotting blood,Discomfort, Burning.

## 2020-09-16 ENCOUNTER — Telehealth: Payer: Self-pay

## 2020-09-16 LAB — IGP, APTIMA HPV: HPV Aptima: NEGATIVE

## 2020-09-16 NOTE — Telephone Encounter (Signed)
Attempted to call results of normal pap smear. Received voicemail. Results are available on MyChart. She will see Dr. Leonides Schanz in one year as previously instructed.

## 2022-12-31 ENCOUNTER — Encounter: Payer: Self-pay | Admitting: Emergency Medicine

## 2022-12-31 ENCOUNTER — Other Ambulatory Visit: Payer: Self-pay

## 2022-12-31 DIAGNOSIS — M25532 Pain in left wrist: Secondary | ICD-10-CM | POA: Insufficient documentation

## 2022-12-31 DIAGNOSIS — X500XXA Overexertion from strenuous movement or load, initial encounter: Secondary | ICD-10-CM | POA: Diagnosis not present

## 2022-12-31 NOTE — ED Triage Notes (Signed)
Patient ambulatory to triage with steady gait, without difficulty or distress noted; pt reports left wrist pain after picking up her dog tonight; no swelling or deformity noted

## 2023-01-01 ENCOUNTER — Emergency Department: Payer: Medicaid Other

## 2023-01-01 ENCOUNTER — Emergency Department
Admission: EM | Admit: 2023-01-01 | Discharge: 2023-01-01 | Disposition: A | Discharge status: Home / Self Care | Payer: Medicaid Other | Attending: Student in an Organized Health Care Education/Training Program | Admitting: Student in an Organized Health Care Education/Training Program

## 2023-01-01 DIAGNOSIS — M25532 Pain in left wrist: Secondary | ICD-10-CM

## 2023-01-01 NOTE — ED Provider Notes (Signed)
[facility] Provider Note    Event Date/Time   First MD Initiated Contact with Patient 01/01/23 0024     (approximate)   History   Wrist Pain   HPI  Terry Walters is a 37 y.o. female presents to the ER for evaluation of left posterior wrist pain that occurred after she was lifting her dog.  Did not feel a popping sensation but felt sudden posterior pain.  Worsened with movement.  No swelling.  No fevers.     Physical Exam   Triage Vital Signs: ED Triage Vitals [12/31/22 2353]  Enc Vitals Group     BP (!) 133/56     Pulse Rate 83     Resp 18     Temp 98.6 F (37 C)     Temp Source Oral     SpO2 95 %     Weight 130 lb (59 kg)     Height 5\' 5"  (1.651 m)     Head Circumference      Peak Flow      Pain Score 5     Pain Loc      Pain Edu?      Excl. in GC?     Most recent vital signs: Vitals:   12/31/22 2353  BP: (!) 133/56  Pulse: 83  Resp: 18  Temp: 98.6 F (37 C)  SpO2: 95%     Constitutional: Alert  Eyes: Conjunctivae are normal.  Head: Atraumatic. Nose: No congestion/rhinnorhea. Mouth/Throat: Mucous membranes are moist.   Neck: Painless ROM.  Cardiovascular:   Good peripheral circulation. Respiratory: Normal respiratory effort.  No retractions.  Gastrointestinal: Soft and nontender.  Musculoskeletal:  no deformity.  Able to passively and actively range wrist.  Some discomfort with abduction.  Neurovascular intact distally. Neurologic:  MAE spontaneously. No gross focal neurologic deficits are appreciated.  Skin:  Skin is warm, dry and intact. No rash noted. Psychiatric: Mood and affect are normal. Speech and behavior are normal.    ED Results / Procedures / Treatments   Labs (all labs ordered are listed, but only abnormal results are displayed) Labs Reviewed - No data to display   EKG     RADIOLOGY Please see ED Course for my review and interpretation.  I personally reviewed all radiographic images ordered to  evaluate for the above acute complaints and reviewed radiology reports and findings.  These findings were personally discussed with the patient.  Please see medical record for radiology report.    PROCEDURES:  Critical Care performed:   Procedures   MEDICATIONS ORDERED IN ED: Medications - No data to display   IMPRESSION / MDM / ASSESSMENT AND PLAN / ED COURSE  I reviewed the triage vital signs and the nursing notes.                              Differential diagnosis includes, but is not limited to, sprain, strain, dislocation, fracture  Presented to ER for evaluation of wrist pain as described above most consistent with soft tissue injury possible ligamentous strain.  X-ray without any evidence of fracture or dislocation.  Will give supportive brace.  Discussed conservative management and outpatient follow-up.  Patient agreeable with plan.   Clinical Course as of 01/01/23 0054  Fri Jan 01, 2023  0028 X-ray on my review and interpretation without evidence of fracture or dislocation. [PR]    Clinical Course User Index [PR]  Willy Eddy, MD     FINAL CLINICAL IMPRESSION(S) / ED DIAGNOSES   Final diagnoses:  Left wrist pain     Rx / DC Orders   ED Discharge Orders     None        Note:  This document was prepared using Dragon voice recognition software and may include unintentional dictation errors.    Willy Eddy, MD 01/01/23 [phone]

## 2024-04-07 ENCOUNTER — Ambulatory Visit: Payer: Self-pay
